# Patient Record
Sex: Female | Born: 1965 | Race: White | Hispanic: No | Marital: Married | Smoking: Never smoker
Health system: Southern US, Community
[De-identification: ages and names within clinical notes are randomized; demographics above are authoritative.]

## PROBLEM LIST (undated history)

## (undated) DIAGNOSIS — J45909 Unspecified asthma, uncomplicated: Secondary | ICD-10-CM

## (undated) DIAGNOSIS — N83209 Unspecified ovarian cyst, unspecified side: Secondary | ICD-10-CM

## (undated) DIAGNOSIS — J309 Allergic rhinitis, unspecified: Secondary | ICD-10-CM

## (undated) DIAGNOSIS — K644 Residual hemorrhoidal skin tags: Secondary | ICD-10-CM

## (undated) DIAGNOSIS — N888 Other specified noninflammatory disorders of cervix uteri: Secondary | ICD-10-CM

## (undated) DIAGNOSIS — IMO0002 Reserved for concepts with insufficient information to code with codable children: Secondary | ICD-10-CM

## (undated) DIAGNOSIS — F32A Depression, unspecified: Secondary | ICD-10-CM

## (undated) DIAGNOSIS — M1990 Unspecified osteoarthritis, unspecified site: Secondary | ICD-10-CM

## (undated) DIAGNOSIS — I10 Essential (primary) hypertension: Secondary | ICD-10-CM

## (undated) DIAGNOSIS — M4040 Postural lordosis, site unspecified: Secondary | ICD-10-CM

## (undated) DIAGNOSIS — Z8742 Personal history of other diseases of the female genital tract: Secondary | ICD-10-CM

## (undated) DIAGNOSIS — K649 Unspecified hemorrhoids: Secondary | ICD-10-CM

## (undated) DIAGNOSIS — F909 Attention-deficit hyperactivity disorder, unspecified type: Secondary | ICD-10-CM

## (undated) DIAGNOSIS — F329 Major depressive disorder, single episode, unspecified: Secondary | ICD-10-CM

## (undated) HISTORY — DX: Personal history of other diseases of the female genital tract: Z87.42

## (undated) HISTORY — PX: REDUCTION MAMMAPLASTY: SUR839

## (undated) HISTORY — DX: Residual hemorrhoidal skin tags: K64.4

## (undated) HISTORY — PX: UPPER GI ENDOSCOPY: SHX6162

## (undated) HISTORY — DX: Allergic rhinitis, unspecified: J30.9

## (undated) HISTORY — PX: BACK SURGERY: SHX140

## (undated) HISTORY — PX: BREAST SURGERY: SHX581

## (undated) HISTORY — DX: Other specified noninflammatory disorders of cervix uteri: N88.8

## (undated) HISTORY — DX: Unspecified ovarian cyst, unspecified side: N83.209

## (undated) HISTORY — PX: TONSILLECTOMY: SUR1361

---
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
## 2004-01-19 HISTORY — PX: ENDOMETRIAL ABLATION: SHX621

## 2015-01-19 HISTORY — PX: COLONOSCOPY: SHX174

## 2015-01-22 LAB — HM COLONOSCOPY

## 2016-07-09 ENCOUNTER — Encounter: Payer: Self-pay | Admitting: Emergency Medicine

## 2016-07-09 ENCOUNTER — Emergency Department (INDEPENDENT_AMBULATORY_CARE_PROVIDER_SITE_OTHER)
Admission: EM | Admit: 2016-07-09 | Discharge: 2016-07-09 | Disposition: A | Discharge status: Home / Self Care | Payer: BLUE CROSS/BLUE SHIELD | Source: Home / Self Care | Attending: Family Medicine | Admitting: Family Medicine

## 2016-07-09 DIAGNOSIS — R0789 Other chest pain: Secondary | ICD-10-CM

## 2016-07-09 HISTORY — DX: Postural lordosis, site unspecified: M40.40

## 2016-07-09 HISTORY — DX: Unspecified asthma, uncomplicated: J45.909

## 2016-07-09 HISTORY — DX: Reserved for concepts with insufficient information to code with codable children: IMO0002

## 2016-07-09 MED ORDER — DICLOFENAC SODIUM 1 % TD GEL: 1.0000 "application " | Freq: Three times a day (TID) | TRANSDERMAL | 1 refills | Status: DC

## 2016-07-09 NOTE — ED Provider Notes (Signed)
[facility]    CSN: [id number] Arrival date & time: 07/09/16  1314     History   Chief Complaint Chief Complaint  Patient presents with  . Abdominal Pain    HPI Adriana Gordon is a 51 y.o. female.   Patient complains of a 3 year history of constant pressure-like mild epigastric pain, described as "squeezing", occasionally associated with mild nausea when the pain is increased.  The pain is not affected by eating or inspiration.  Her bowel movements have been normal.  She has had complete GI evaluation that did not reveal a cause of her pain.  She feels well otherwise. Patient moved to the area from [place] three months ago. Past history of breast reduction 20 years ago, endometrial ablation, and C-section.   The history is provided by the patient.    Past Medical History:  Diagnosis Date  . Asthma   . 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   Past Surgical History:  Procedure Laterality Date  . ABDOMINAL SURGERY    . BACK SURGERY    . BREAST SURGERY    . TONSILLECTOMY      OB History    No data available       Home Medications    Prior to Admission medications   Medication Sig Start Date End Date Taking? Authorizing Provider  buPROPion (WELLBUTRIN XL) 300 MG 24 hr tablet Take 300 mg by mouth daily.   Yes [provider]  diclofenac sodium (VOLTAREN) 1 % GEL Apply 1 application topically 3 (three) times daily. Apply 2 to 3 gm each application 07/09/16   Lattie Haw, MD    Family History Family History  Problem Relation Age of Onset  . Hypertension Mother   . Depression Mother   . Sjogren's syndrome Mother   . Cancer Father   . Asthma Father   . COPD Father     Social History Social History  Substance Use Topics  . Smoking status: Never Smoker  . Smokeless tobacco: Never Used  . Alcohol use Yes     Allergies   Phenergan [promethazine  hcl]   Review of Systems Review of Systems  Constitutional: Negative for activity change, appetite change, chills, diaphoresis, fatigue, fever and unexpected weight change.  HENT: Negative.   Eyes: Negative.   Respiratory: Positive for chest tightness. Negative for shortness of breath and wheezing.   Cardiovascular: Negative.   Gastrointestinal: Positive for abdominal pain and nausea. Negative for abdominal distention, blood in stool, constipation, diarrhea and vomiting.  Genitourinary: Negative.   Musculoskeletal: Negative.   Neurological: Negative.      Physical Exam Triage Vital Signs ED Triage Vitals [07/09/16 1348]  Enc Vitals Group     BP (!) 156/95     Pulse Rate 100     Resp      Temp 98.7 F (37.1 C)     Temp Source Oral     SpO2 100 %     Weight 120 lb (54.4 kg)     Height 5\' 5"  (1.651 m)     Head Circumference      Peak Flow      Pain Score 5     Pain Loc      Pain Edu?      Excl. in GC?    No data found.   Updated Vital Signs BP (!) 156/95 (BP Location: Left  Arm)   Pulse 100   Temp 98.7 F (37.1 C) (Oral)   Ht 5\' 5"  (1.651 m)   Wt 120 lb (54.4 kg)   SpO2 100%   BMI 19.97 kg/m   Visual Acuity Right Eye Distance:   Left Eye Distance:   Bilateral Distance:    Right Eye Near:   Left Eye Near:    Bilateral Near:     Physical Exam  Constitutional: She appears well-developed and well-nourished. No distress.  HENT:  Head: Normocephalic.  Right Ear: External ear normal.  Left Ear: External ear normal.  Nose: Nose normal.  Mouth/Throat: Oropharynx is clear and moist.  Eyes: Conjunctivae and EOM are normal. Pupils are equal, round, and reactive to light.  Neck: Normal range of motion. Neck supple.  Cardiovascular: Normal rate, regular rhythm and normal heart sounds.   Pulmonary/Chest: Breath sounds normal. She has no wheezes. She has no rales. She exhibits tenderness.    Patient has distinct tenderness to palpation over her xiphoid and  sub-xiphoid area at well healed surgical scars from her breast reduction.  Palpation there recreates her pain.  Abdominal: Soft. Bowel sounds are normal. She exhibits no distension and no mass. There is no tenderness. There is no rebound and no guarding. No hernia.  Lymphadenopathy:    She has no cervical adenopathy.  Neurological: She is alert.  Skin: Skin is warm and dry. No rash noted.  Nursing note and vitals reviewed.    UC Treatments / Results  Labs (all labs ordered are listed, but only abnormal results are displayed) Labs Reviewed - No data to display  EKG  EKG Interpretation None       Radiology No results found.  Procedures Procedures (including critical care time)  Medications Ordered in UC Medications - No data to display   Initial Impression / Assessment and Plan / UC Course  I have reviewed the triage vital signs and the nursing notes.  Pertinent labs & imaging results that were available during my care of the patient were reviewed by me and considered in my medical decision making (see chart for details).    Begin conservative trial of topical diclofenac gel TID. Apply ice pack for 10 to 15 minutes, 2 to 3 times daily  Continue until pain and swelling decrease.  Followup with Dr. Rodney Langtonhomas Thekkekandam or Dr. Clementeen GrahamEvan Corey ([facility]) if not improving about two weeks.     Final Clinical Impressions(s) / UC Diagnoses   Final diagnoses:  Xiphoidalgia    New Prescriptions New Prescriptions   DICLOFENAC SODIUM (VOLTAREN) 1 % GEL    Apply 1 application topically 3 (three) times daily. Apply 2 to 3 gm each application     Lattie HawBeese, Prynce Jacober A, MD 07/11/16 1012

## 2016-07-09 NOTE — ED Triage Notes (Signed)
Epigastric pain, constant, 3 years

## 2016-07-09 NOTE — Discharge Instructions (Signed)
Apply ice pack for 10 to 15 minutes, 2 to 3 times daily  Continue until pain and swelling decrease.

## 2016-07-13 ENCOUNTER — Ambulatory Visit (INDEPENDENT_AMBULATORY_CARE_PROVIDER_SITE_OTHER): Payer: BLUE CROSS/BLUE SHIELD | Admitting: Sports Medicine

## 2016-07-13 DIAGNOSIS — R1900 Intra-abdominal and pelvic swelling, mass and lump, unspecified site: Secondary | ICD-10-CM

## 2016-07-13 DIAGNOSIS — R1013 Epigastric pain: Secondary | ICD-10-CM

## 2016-07-13 LAB — CBC WITH DIFFERENTIAL/PLATELET
Basophils Absolute: 71 cells/uL (ref 0–200)
Basophils Relative: 1 %
Eosinophils Absolute: 71 {cells}/uL (ref 15–500)
Eosinophils Relative: 1 %
HCT: 43.2 % (ref 35.0–45.0)
Hemoglobin: 13.8 g/dL (ref 11.7–15.5)
Lymphocytes Relative: 30 %
Lymphs Abs: 2130 cells/uL (ref 850–3900)
MCH: 31.1 pg (ref 27.0–33.0)
MCHC: 31.9 g/dL — ABNORMAL LOW (ref 32.0–36.0)
MCV: 97.3 fL (ref 80.0–100.0)
MPV: 10.4 fL (ref 7.5–12.5)
Monocytes Absolute: 710 cells/uL (ref 200–950)
Monocytes Relative: 10 %
Neutro Abs: 4118 {cells}/uL (ref 1500–7800)
Neutrophils Relative %: 58 %
Platelets: 324 K/uL (ref 140–400)
RBC: 4.44 MIL/uL (ref 3.80–5.10)
RDW: 13.4 % (ref 11.0–15.0)
WBC: 7.1 10*3/uL (ref 3.8–10.8)

## 2016-07-13 MED ORDER — SUCRALFATE 1 G PO TABS: 1.0000 g | ORAL_TABLET | Freq: Four times a day (QID) | ORAL | 0 refills | Status: DC

## 2016-07-13 MED ORDER — DEXLANSOPRAZOLE 60 MG PO CPDR: 60.0000 mg | DELAYED_RELEASE_CAPSULE | Freq: Every day | ORAL | 11 refills | Status: DC

## 2016-07-13 NOTE — Assessment & Plan Note (Addendum)
Adriana Gordon had a very extensive workup in the distant past including an upper endoscopy that showed what sounds to be chronic gastritis, she has been on acid blockers for short periods of time without much relief, she's also had an ultrasound in the past that she tells me was negative. Blood work in the past including H. pylori testing has been negative. This epigastric pain has been present for years. At one point her gastroenterologist, in spite of chronic gastritis on her upper endoscopy told her that it was musculoskeletal. She was seen in urgent care and was told that this may represent xiphoidalgia or costochondritis, however she is not the right age distribution for this. On exam today her pain is directly over her epigastrium and her stomach with not so much pain over the xiphoid process, costal margin, or scars from her breast reduction. She denies any symptoms of chronic GI bleed, but I'm convinced her pain is from her chronic gastritis.  I'm going get a CT of the abdomen and pelvis with oral and IV contrast in a nonemergent fashion, start Dexilant, Carafate. We are checking blood work, urinalysis, and she'll return in one month. She promises to take the medication consistently. If there is no improvement in one month we will consider referral to GI for another upper endoscopy, and certainly we can consider a HIDA scan to evaluate for concordant pain with CCK injection and gallbladder ejection fraction. Labs look good with the exception of a slightly low mean corpuscular hemoglobin concentration, considering concern for chronic gastritis I am going to add an anemia panel.  Return to see me in one month.  Dexilant is too expensive, switching to Nexium 40mg  twice a day

## 2016-07-13 NOTE — Progress Notes (Signed)
Subjective:    I'm seeing this patient as a consultation for:  Dr. Donna ChristenStephen Beese  CC: Epigastric pain  HPI: Adriana Gordon had a very extensive workup in the distant past including an upper endoscopy that showed what sounds to be chronic gastritis, she has been on acid blockers for short periods of time without much relief, she's also had an ultrasound in the past that she tells me was negative. Blood work in the past including H. pylori testing has been negative. This epigastric pain has been present for years. At one point her gastroenterologist, in spite of chronic gastritis on her upper endoscopy told her that it was musculoskeletal. She was seen in urgent care and was told that this may represent xiphoidalgia or costochondritis, however she is not the right age distribution for this. On exam today her pain is directly over her epigastrium and her stomach with not so much pain over the xiphoid process, costal margin, or scars from her breast reduction. She denies any symptoms of chronic GI bleed, but I'm convinced her pain is from her chronic gastritis.  Past medical history:  Negative.  See flowsheet/record as well for more information.  Surgical history: Negative.  See flowsheet/record as well for more information.  Family history: Negative.  See flowsheet/record as well for more information.  Social history: Negative.  See flowsheet/record as well for more information.  Allergies, and medications have been entered into the medical record, reviewed, and no changes needed.   Review of Systems: No headache, visual changes, nausea, vomiting, diarrhea, constipation, dizziness, abdominal pain, skin rash, fevers, chills, night sweats, weight loss, swollen lymph nodes, body aches, joint swelling, muscle aches, chest pain, shortness of breath, mood changes, visual or auditory hallucinations.   Objective:   General: Well Developed, well nourished, and in no acute distress.  Neuro/Psych: Alert and oriented  x3, extra-ocular muscles intact, able to move all 4 extremities, sensation grossly intact. Skin: Warm and dry, no rashes noted.  Respiratory: Not using accessory muscles, speaking in full sentences, trachea midline.  Cardiovascular: Pulses palpable, no extremity edema. Abdomen: I am unable to appreciate significant tenderness over the xiphoid process where it's only mild, or over the costal margin. Her breast reduction scar is well-healed and has no tenderness, she does have moderate to severe tenderness about 4 cm distal to the xiphoid process in the epigastrium and over the stomach.  Impression and Recommendations:   This case required medical decision making of moderate complexity.  Acute epigastric pain Adriana Gordon had a very extensive workup in the distant past including an upper endoscopy that showed what sounds to be chronic gastritis, she has been on acid blockers for short periods of time without much relief, she's also had an ultrasound in the past that she tells me was negative. Blood work in the past including H. pylori testing has been negative. This epigastric pain has been present for years. At one point her gastroenterologist, in spite of chronic gastritis on her upper endoscopy told her that it was musculoskeletal. She was seen in urgent care and was told that this may represent xiphoidalgia or costochondritis, however she is not the right age distribution for this. On exam today her pain is directly over her epigastrium and her stomach with not so much pain over the xiphoid process, costal margin, or scars from her breast reduction. She denies any symptoms of chronic GI bleed, but I'm convinced her pain is from her chronic gastritis.  I'm going get a CT of  the abdomen and pelvis with oral and IV contrast in a nonemergent fashion, start Dexilant, Carafate. We are checking blood work, urinalysis, and she'll return in one month. She promises to take the medication consistently. If there is no  improvement in one month we will consider referral to GI for another upper endoscopy, and certainly we can consider a HIDA scan to evaluate for concordant pain with CCK injection and gallbladder ejection fraction.  Return to see me in one month.

## 2016-07-14 ENCOUNTER — Ambulatory Visit (INDEPENDENT_AMBULATORY_CARE_PROVIDER_SITE_OTHER): Payer: BLUE CROSS/BLUE SHIELD

## 2016-07-14 ENCOUNTER — Telehealth: Payer: Self-pay | Admitting: *Deleted

## 2016-07-14 DIAGNOSIS — N888 Other specified noninflammatory disorders of cervix uteri: Secondary | ICD-10-CM | POA: Insufficient documentation

## 2016-07-14 DIAGNOSIS — R935 Abnormal findings on diagnostic imaging of other abdominal regions, including retroperitoneum: Secondary | ICD-10-CM

## 2016-07-14 DIAGNOSIS — R1013 Epigastric pain: Secondary | ICD-10-CM

## 2016-07-14 HISTORY — DX: Other specified noninflammatory disorders of cervix uteri: N88.8

## 2016-07-14 LAB — TSH: TSH: 2.1 m[IU]/L

## 2016-07-14 LAB — URINALYSIS
Bilirubin Urine: NEGATIVE
Glucose, UA: NEGATIVE
Hgb urine dipstick: NEGATIVE
Ketones, ur: NEGATIVE
Leukocytes, UA: NEGATIVE
Nitrite: NEGATIVE
Protein, ur: NEGATIVE
Specific Gravity, Urine: 1.016 (ref 1.001–1.035)
pH: 6 (ref 5.0–8.0)

## 2016-07-14 LAB — COMPREHENSIVE METABOLIC PANEL
AST: 11 U/L (ref 10–35)
Albumin: 4.7 g/dL (ref 3.6–5.1)
CO2: 24 mmol/L (ref 20–31)
Calcium: 9.7 mg/dL (ref 8.6–10.4)
Chloride: 101 mmol/L (ref 98–110)
Glucose, Bld: 90 mg/dL (ref 65–99)
Potassium: 4.1 mmol/L (ref 3.5–5.3)
Total Bilirubin: 0.6 mg/dL (ref 0.2–1.2)

## 2016-07-14 LAB — HEMOGLOBIN A1C
Hgb A1c MFr Bld: 5 % (ref <5.7)
Mean Plasma Glucose: 97 mg/dL

## 2016-07-14 LAB — COMPREHENSIVE METABOLIC PANEL WITH GFR
ALT: 11 U/L (ref 6–29)
Alkaline Phosphatase: 52 U/L (ref 33–130)
BUN: 15 mg/dL (ref 7–25)
Creat: 1 mg/dL (ref 0.50–1.05)
Sodium: 137 mmol/L (ref 135–146)
Total Protein: 7.5 g/dL (ref 6.1–8.1)

## 2016-07-14 LAB — LIPID PANEL W/REFLEX DIRECT LDL
Cholesterol: 204 mg/dL — ABNORMAL HIGH (ref <200)
HDL: 67 mg/dL (ref >50)
LDL-Cholesterol: 115 mg/dL — ABNORMAL HIGH
Non-HDL Cholesterol (Calc): 137 mg/dL — ABNORMAL HIGH (ref <130)
Total CHOL/HDL Ratio: 3 ratio (ref <5.0)
Triglycerides: 108 mg/dL (ref <150)

## 2016-07-14 LAB — URINE CULTURE: Organism ID, Bacteria: NO GROWTH

## 2016-07-14 LAB — LIPASE: Lipase: 14 U/L (ref 7–60)

## 2016-07-14 LAB — HIV ANTIBODY (ROUTINE TESTING W REFLEX): HIV 1&2 Ab, 4th Generation: NONREACTIVE

## 2016-07-14 LAB — AMYLASE: Amylase: 39 U/L (ref 21–101)

## 2016-07-14 LAB — VITAMIN D 25 HYDROXY (VIT D DEFICIENCY, FRACTURES): Vit D, 25-Hydroxy: 44 ng/mL (ref 30–100)

## 2016-07-14 MED ORDER — IOPAMIDOL (ISOVUE-300) INJECTION 61%
100.0000 mL | Freq: Once | INTRAVENOUS | Status: AC | PRN
  Administered 2016-07-14: 100 mL via INTRAVENOUS

## 2016-07-14 NOTE — Assessment & Plan Note (Signed)
There is an abnormality in the cervix with multiple low-density lesions, per radiology report this could represent multiple infected nabothian cysts however there is a chance that this could represent a low-grade uterine malignancy. Referral to OB/GYN for further evaluation.   As for the pain in the upper abdomen, I do think it still represents a gastritis, so plan does not change regarding the new acid blocking medications.

## 2016-07-14 NOTE — Telephone Encounter (Signed)
Called pt's pharmacy and informed Adriana Gordon that pt's prescription for Dexilant has been approved.Adriana Gordon, Adriana Gordon

## 2016-07-14 NOTE — Addendum Note (Signed)
Addended by: Monica BectonHEKKEKANDAM, Candace Begue J on: 07/14/2016 07:58 AM   Modules accepted: Orders

## 2016-07-14 NOTE — Addendum Note (Signed)
Addended by: Monica BectonHEKKEKANDAM, Tiesha Marich J on: 07/14/2016 11:50 AM   Modules accepted: Orders

## 2016-07-14 NOTE — Telephone Encounter (Signed)
Pt informed of approval..Reena Borromeo, Viann Shoveonya Lynetta

## 2016-07-15 ENCOUNTER — Encounter: Payer: Self-pay | Admitting: Physician Assistant

## 2016-07-15 ENCOUNTER — Ambulatory Visit (INDEPENDENT_AMBULATORY_CARE_PROVIDER_SITE_OTHER): Payer: BLUE CROSS/BLUE SHIELD | Admitting: Physician Assistant

## 2016-07-15 VITALS — BP 128/84 | HR 84 | Resp 16 | Ht 60.0 in | Wt 119.2 lb

## 2016-07-15 DIAGNOSIS — Z1231 Encounter for screening mammogram for malignant neoplasm of breast: Secondary | ICD-10-CM

## 2016-07-15 DIAGNOSIS — L989 Disorder of the skin and subcutaneous tissue, unspecified: Secondary | ICD-10-CM | POA: Insufficient documentation

## 2016-07-15 DIAGNOSIS — Z1283 Encounter for screening for malignant neoplasm of skin: Secondary | ICD-10-CM

## 2016-07-15 DIAGNOSIS — Z7689 Persons encountering health services in other specified circumstances: Secondary | ICD-10-CM | POA: Diagnosis not present

## 2016-07-15 DIAGNOSIS — E7800 Pure hypercholesterolemia, unspecified: Secondary | ICD-10-CM | POA: Insufficient documentation

## 2016-07-15 DIAGNOSIS — Z1239 Encounter for other screening for malignant neoplasm of breast: Secondary | ICD-10-CM

## 2016-07-15 LAB — VITAMIN B12: Vitamin B-12: 390 pg/mL (ref 200–1100)

## 2016-07-15 LAB — IRON AND TIBC
%SAT: 43 % (ref 11–50)
Iron: 167 ug/dL — ABNORMAL HIGH (ref 45–160)
TIBC: 384 ug/dL (ref 250–450)
UIBC: 217 ug/dL

## 2016-07-15 LAB — FERRITIN: Ferritin: 30 ng/mL (ref 10–232)

## 2016-07-15 LAB — FOLATE: Folate: 15.9 ng/mL (ref >5.4)

## 2016-07-15 MED ORDER — CALCIUM CARBONATE-VITAMIN D 600-400 MG-UNIT PO TABS: 1.0000 | ORAL_TABLET | Freq: Two times a day (BID) | ORAL | 11 refills | Status: DC

## 2016-07-15 MED ORDER — ESOMEPRAZOLE MAGNESIUM 40 MG PO CPDR: 40.0000 mg | DELAYED_RELEASE_CAPSULE | Freq: Two times a day (BID) | ORAL | 11 refills | Status: DC

## 2016-07-15 NOTE — Addendum Note (Signed)
Addended by: Monica BectonHEKKEKANDAM, Hazell Siwik J on: 07/15/2016 10:55 AM   Modules accepted: Orders

## 2016-07-15 NOTE — Patient Instructions (Addendum)
-   Please schedule an appointment for shave biopsy   Skin Biopsy A skin biopsy is a procedure to remove a sample of your skin (lesion) so it can be checked under a microscope. You may need a skin biopsy if you have a skin disease or abnormal changes in your skin. Tell a health care provider about:  Any allergies you have.  All medicines you are taking, including vitamins, herbs, eye drops, creams, and over-the-counter medicines.  Any problems you or family members have had with anesthetic medicines.  Any blood disorders you have.  Any surgeries you have had.  Any medical conditions you have. What are the risks? Generally, this is a safe procedure. However, problems may occur, including:  Infection.  Bleeding.  Allergic reaction to medicines.  Scarring.  What happens before the procedure?  Ask your health care provider about changing or stopping your regular medicines. This is especially important if you are taking diabetes medicines or blood thinners.  Follow instructions from your health care provider about how to care for your skin.  Ask your health care provider how your biopsy site will be marked or identified.  You may be given antibiotic medicine to prevent infection. What happens during the procedure?  To reduce your risk of infection: ? Your health care team will wash or sanitize their hands. ? Your skin will be washed with soap.  You may be given a medicine to help you relax (sedative).  You will be givena medicine to numb the area (local anesthetic).  The method that your health care provider will use for your skin biopsy will depend on the type of skin problem you have. Options include: ? Shave biopsy. Your health care provider will shave away layers of your skin lesion with a sharp blade. After shaving, a gel or ointment may be used to control bleeding. ? Punch biopsy. Your health care provider will use a tool to remove all or part of the lesion. This  leaves a small hole about the width of a pencil eraser. The area may be covered with a gel or ointment. ? Excisional or incisional biopsy. Your health care provider will use a surgical blade to remove all or part of your lesion.  Your skin biopsy site may be closed with stitches (sutures).  A bandage (dressing) will be applied. The procedure may vary among health care providers and hospitals. What happens after the procedure?  Your skin sample will be sent to a laboratory for examination.  Your skin biopsy site will be watched to make sure that it stops bleeding.  Do not drive for 24 hours if you received a sedative. This information is not intended to replace advice given to you by your health care provider. Make sure you discuss any questions you have with your health care provider. Document Released: 02/06/2004 Document Revised: 08/31/2015 Document Reviewed: 04/03/2014 [employer] Interactive Patient Education  [employer]2018 [employer]

## 2016-07-15 NOTE — Progress Notes (Signed)
HPI:                                                                Adriana Gordon is a 51 y.o. female who presents to [place]: [place] today to establish care  Current Concerns include skin check   Health Maintenance Health Maintenance  Topic Date Due  . TETANUS/TDAP  12/12/1983  . PAP SMEAR  12/11/1985  . MAMMOGRAM  12/12/2014  . COLONOSCOPY  12/12/2014  . INFLUENZA VACCINE  08/18/2016  . HIV Screening  Completed    GYN/Sexual Health  Menstrual status: postmenopausal  LMP: 2006  Sexually active: yes  Current contraception: none, s/p ablation   Past Medical History:  Diagnosis Date  . Allergic rhinitis   . Asthma    childhood  . External hemorrhoids   . History of menorrhagia   . 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 Ovarian cyst    Past Surgical History:  Procedure Laterality Date  . BACK SURGERY    . BREAST SURGERY     reduction  . CESAREAN SECTION    . COLONOSCOPY  2017  . ENDOMETRIAL ABLATION  2006  . TONSILLECTOMY     Social History  Substance Use Topics  . Smoking status: Never Smoker  . Smokeless tobacco: Never Used  . Alcohol use Yes   family history includes Alcohol abuse in her mother; Asthma in her father; COPD in her father; Cancer in her father; Depression in her brother and mother; Diabetes in her maternal grandmother; Heart attack in her paternal grandfather; Hyperlipidemia in her father; Hypertension in her father and mother; Prostate cancer in her father; Rheum arthritis in her cousin; Sjogren's syndrome in her mother; Stroke in her maternal grandfather.  ROS: negative except as noted in the HPI  Medications: Current Outpatient Prescriptions  Medication Sig Dispense Refill  . buPROPion (WELLBUTRIN XL) 300 MG 24 hr tablet Take 300 mg by mouth daily.    Marland Kitchen triamcinolone (NASACORT) 55 MCG/ACT AERO nasal inhaler Place 2 sprays into the nose daily.    . Calcium Carbonate-Vitamin D  600-400 MG-UNIT tablet Take 1 tablet by mouth 2 (two) times daily. 60 tablet 11  . esomeprazole (NEXIUM) 40 MG capsule Take 1 capsule (40 mg total) by mouth 2 (two) times daily before a meal. 60 capsule 11   No current facility-administered medications for this visit.    Allergies  Allergen Reactions  . Phenergan [Promethazine Hcl]        Objective:  BP 128/84   Pulse 84   Resp 16   Ht 5' (1.524 m)   Wt 119 lb 3.2 oz (54.1 kg)   LMP 12/11/2004   BMI 23.28 kg/m  Gen: well-groomed, cooperative, appears younger than stated age, not ill-appearing, no distress HEENT: normal conjunctiva, neck supple, no thyromegaly or tenderness Pulm: Normal work of breathing, normal phonation, clear to auscultation bilaterally CV: Normal rate, regular rhythm, s1 and s2 distinct, no murmurs, clicks or rubs, no carotid bruit Neuro: alert and oriented x 3, EOM's intact, normal tone, no tremor MSK: moving all extremities, normal gait and station, no peripheral edema Skin: warm and dry, approx 37m skin-colored papule on right anterior chest wall, multiple spider hemangiomas Psych: normal affect,  euthymic mood, normal speech and thought content   Results for orders placed or performed in visit on 07/13/16 (from the past 72 hour(s))  CBC with Differential/Platelet     Status: Abnormal   Collection Time: 07/13/16 11:32 AM  Result Value Ref Range   WBC 7.1 3.8 - 10.8 K/uL   RBC 4.44 3.80 - 5.10 MIL/uL   Hemoglobin 13.8 11.7 - 15.5 g/dL   HCT 43.2 35.0 - 45.0 %   MCV 97.3 80.0 - 100.0 fL   MCH 31.1 27.0 - 33.0 pg   MCHC 31.9 (L) 32.0 - 36.0 g/dL   RDW 13.4 11.0 - 15.0 %   Platelets 324 140 - 400 K/uL   MPV 10.4 7.5 - 12.5 fL   Neutro Abs 4,118 1,500 - 7,800 cells/uL   Lymphs Abs 2,130 850 - 3,900 cells/uL   Monocytes Absolute 710 200 - 950 cells/uL   Eosinophils Absolute 71 15 - 500 cells/uL   Basophils Absolute 71 0 - 200 cells/uL   Neutrophils Relative % 58 %   Lymphocytes Relative 30 %    Monocytes Relative 10 %   Eosinophils Relative 1 %   Basophils Relative 1 %   Smear Review Criteria for review not met   Comprehensive metabolic panel     Status: None   Collection Time: 07/13/16 11:32 AM  Result Value Ref Range   Sodium 137 135 - 146 mmol/L   Potassium 4.1 3.5 - 5.3 mmol/L   Chloride 101 98 - 110 mmol/L   CO2 24 20 - 31 mmol/L   Glucose, Bld 90 65 - 99 mg/dL   BUN 15 7 - 25 mg/dL   Creat 1.00 0.50 - 1.05 mg/dL    Comment:   For patients > or = 52 years of age: The upper reference limit for Creatinine is approximately 13% higher for people identified as African-American.      Total Bilirubin 0.6 0.2 - 1.2 mg/dL   Alkaline Phosphatase 52 33 - 130 U/L   AST 11 10 - 35 U/L   ALT 11 6 - 29 U/L   Total Protein 7.5 6.1 - 8.1 g/dL   Albumin 4.7 3.6 - 5.1 g/dL   Calcium 9.7 8.6 - 10.4 mg/dL  Lipase     Status: None   Collection Time: 07/13/16 11:32 AM  Result Value Ref Range   Lipase 14 7 - 60 U/L  Amylase     Status: None   Collection Time: 07/13/16 11:32 AM  Result Value Ref Range   Amylase 39 21 - 101 U/L    Comment: ** Please note change in reference range(s). **     Hemoglobin A1c     Status: None   Collection Time: 07/13/16 11:32 AM  Result Value Ref Range   Hgb A1c MFr Bld 5.0 <5.7 %    Comment:   For the purpose of screening for the presence of diabetes:   <5.7%       Consistent with the absence of diabetes 5.7-6.4 %   Consistent with increased risk for diabetes (prediabetes) >=6.5 %     Consistent with diabetes   This assay result is consistent with a decreased risk of diabetes.   Currently, no consensus exists regarding use of hemoglobin A1c for diagnosis of diabetes in children.   According to American Diabetes Association (ADA) guidelines, hemoglobin A1c <7.0% represents optimal control in non-pregnant diabetic patients. Different metrics may apply to specific patient populations. Standards of Medical Care in Diabetes (  ADA).      Mean  Plasma Glucose 97 mg/dL  Lipid Panel w/reflex Direct LDL     Status: Abnormal   Collection Time: 07/13/16 11:32 AM  Result Value Ref Range   Cholesterol 204 (H) <200 mg/dL   Triglycerides 108 <150 mg/dL   HDL 67 >50 mg/dL   Total CHOL/HDL Ratio 3.0 <5.0 Ratio   Non-HDL Cholesterol (Calc) 137 (H) <130 mg/dL    Comment:   For patients with diabetes plus 1 major ASCVD risk factor, treating to a non-HDL-C goal of <100 mg/dL (LDL-C of <70 mg/dL) is considered a therapeutic option.      LDL-Cholesterol 115 (H) mg/dL    Comment: Reference range: <100   Desirable range <100 mg/dL for primary prevention; <70 mg/dL for patients with CHD or diabetic patients with > or = 2 CHD risk factors.     The Martin-Hopkins calculation is a validated novel method that provides better accuracy than the Friedwald equation in the estimation of LDL-C, particularly when TG levels are 150-400 mg/dL and LDL-C levels are lower than 70 mg/dL. Reference:  Cresenciano Genre et al.  Comparison of a Novel Method vs the Aurelio Jew for Estimating Low-Density Lipoprotein Cholesterol Levels From the Standard Lipid Profile.  JAMA. 1950;932(67): 2061-2068.   For additional information, please refer to http://education.[facility].com/faq/FAQ164 (This link is being provided for informational/educational purposes only.)   TSH     Status: None   Collection Time: 07/13/16 11:32 AM  Result Value Ref Range   TSH 2.10 mIU/L    Comment:   Reference Range   > or = 20 Years  0.40-4.50   Pregnancy Range First trimester  0.26-2.66 Second trimester 0.55-2.73 Third trimester  0.43-2.91     VITAMIN D 25 Hydroxy (Vit-D Deficiency, Fractures)     Status: None   Collection Time: 07/13/16 11:32 AM  Result Value Ref Range   Vit D, 25-Hydroxy 44 30 - 100 ng/mL    Comment: Vitamin D Status           25-OH Vitamin D        Deficiency                <20 ng/mL        Insufficiency         20 - 29 ng/mL        Optimal              > or = 30 ng/mL   For 25-OH Vitamin D testing on patients on D2-supplementation and patients for whom quantitation of D2 and D3 fractions is required, the [facility] 25-OH VIT D, (D2,D3), LC/MS/MS is recommended: order code 364 [id number] 2521 (patients > 2 yrs).   Urinalysis     Status: None   Collection Time: 07/13/16 11:32 AM  Result Value Ref Range   Color, Urine YELLOW YELLOW   APPearance CLEAR CLEAR   Specific Gravity, Urine 1.016 1.001 - 1.035   pH 6.0 5.0 - 8.0   Glucose, UA NEGATIVE NEGATIVE   Bilirubin Urine NEGATIVE NEGATIVE   Ketones, ur NEGATIVE NEGATIVE   Hgb urine dipstick NEGATIVE NEGATIVE   Protein, ur NEGATIVE NEGATIVE   Nitrite NEGATIVE NEGATIVE   Leukocytes, UA NEGATIVE NEGATIVE  HIV antibody     Status: None   Collection Time: 07/13/16 11:32 AM  Result Value Ref Range   HIV 1&2 Ab, 4th Generation NONREACTIVE NONREACTIVE    Comment:   HIV-1 antigen and HIV-1/HIV-2 antibodies were not detected.  There is  no laboratory evidence of HIV infection.   HIV-1/2 Antibody Diff        Not indicated. HIV-1 RNA, Qual TMA          Not indicated.     PLEASE NOTE: This information has been disclosed to you from records whose confidentiality may be protected by state law. If your state requires such protection, then the state law prohibits you from making any further disclosure of the information without the specific written consent of the person to whom it pertains, or as otherwise permitted by law. A general authorization for the release of medical or other information is NOT sufficient for this purpose.   The performance of this assay has not been clinically validated in patients less than 65 years old.   For additional information please refer to http://education.[facility].com/faq/FAQ106.  (This link is being provided for informational/educational purposes only.)     Urine Culture     Status: None   Collection Time: 07/13/16 11:34 AM  Result Value Ref Range    Organism ID, Bacteria NO GROWTH   Vitamin B12     Status: None   Collection Time: 07/14/16  7:58 AM  Result Value Ref Range   Vitamin B-12 390 200 - 1,100 pg/mL  Folate     Status: None   Collection Time: 07/14/16  7:58 AM  Result Value Ref Range   Folate 15.9 >5.4 ng/mL    Comment: Reference Range >17 years:   Low: <3.4 ng/mL              Borderline: 3.4-5.4 ng/mL              Normal: >5.4 ng/mL     Iron and TIBC     Status: Abnormal   Collection Time: 07/14/16  7:58 AM  Result Value Ref Range   Iron 167 (H) 45 - 160 ug/dL   UIBC 217 ug/dL   TIBC 384 250 - 450 ug/dL   %SAT 43 11 - 50 %  Ferritin     Status: None   Collection Time: 07/14/16  7:58 AM  Result Value Ref Range   Ferritin 30 10 - 232 ng/mL   Ct Abdomen Pelvis W Contrast  Result Date: 07/14/2016 CLINICAL DATA:  Acute epigastric pain. EXAM: CT ABDOMEN AND PELVIS WITH CONTRAST TECHNIQUE: Multidetector CT imaging of the abdomen and pelvis was performed using the standard protocol following bolus administration of intravenous contrast. CONTRAST:  170m ISOVUE-300 IOPAMIDOL (ISOVUE-300) INJECTION 61% COMPARISON:  None. FINDINGS: Lower chest: Normal. Hepatobiliary: No focal liver abnormality is seen. No gallstones, gallbladder wall thickening, or biliary dilatation. Pancreas: Unremarkable. No pancreatic ductal dilatation or surrounding inflammatory changes. Spleen: Normal in size without focal abnormality. Adrenals/Urinary Tract: Adrenal glands are unremarkable. Kidneys are normal, without renal calculi, focal lesion, or hydronephrosis. Bladder is unremarkable. Stomach/Bowel: Stomach is within normal limits. Appendix appears normal. No evidence of bowel wall thickening, distention, or inflammatory changes. Vascular/Lymphatic: No significant vascular findings are present. No enlarged abdominal or pelvic lymph nodes. Reproductive: There is an abnormal soft tissue density at the cervix with multiple low-density areas. This could  represent multiple nabothian cysts with infection. However, the low-density extends into the endometrial cavity. The possibility of low-grade malignancy such as adenoma malignum should be considered. I recommend correlation with pelvic exam. Pelvic ultrasound and/or pelvic MRI may be useful for further characterization based on the findings of the pelvic exam. 2 cm cyst on the right ovary. Left ovary and uterus appear normal. Other: No  abdominal wall hernia or abnormality. No abdominopelvic ascites. Musculoskeletal: No acute or significant osseous findings. IMPRESSION: 1. Abnormal appearance of the cervix as described above. 2. Otherwise benign appearing abdomen and pelvis. Electronically Signed   By: Lorriane Shire M.D.   On: 07/14/2016 10:53    Depression screen PHQ 2/9 07/15/2016  Decreased Interest 0  Down, Depressed, Hopeless 0  PHQ - 2 Score 0    Assessment and Plan: 51 y.o. female with   1. Encounter to establish care - reviewed PMH - reviewed recent labs, which were wnl - negative PHQ2 - Colonoscopy - UTD per patient, Dr. Baxter Flattery, requesting outside records - referred to GYN for Pap due to CT finding of abnormal appearing cervix  2. Borderline Hypercholesteremia - therapeutic lifestyle  3. Screening for breast cancer - MM DIGITAL SCREENING BILATERAL; Future  4. Encounter for screening for malignant neoplasm of skin   5. Skin lesion of chest wall - return for shave biopsy   Patient education and anticipatory guidance given Patient agrees with treatment plan Follow-up in 1 week for skin biopsy or sooner as needed  Darlyne Russian PA-C

## 2016-07-22 ENCOUNTER — Encounter: Payer: Self-pay | Admitting: Physician Assistant

## 2016-07-22 ENCOUNTER — Ambulatory Visit (INDEPENDENT_AMBULATORY_CARE_PROVIDER_SITE_OTHER): Payer: BLUE CROSS/BLUE SHIELD | Admitting: Physician Assistant

## 2016-07-22 VITALS — BP 143/93 | HR 81 | Wt 123.0 lb

## 2016-07-22 DIAGNOSIS — Z7689 Persons encountering health services in other specified circumstances: Secondary | ICD-10-CM

## 2016-07-22 DIAGNOSIS — Z01818 Encounter for other preprocedural examination: Secondary | ICD-10-CM | POA: Diagnosis not present

## 2016-07-22 DIAGNOSIS — L989 Disorder of the skin and subcutaneous tissue, unspecified: Secondary | ICD-10-CM

## 2016-07-22 NOTE — Progress Notes (Signed)
Vitals:   07/22/16 0948  BP: (!) 143/93  Pulse: 81    Procedure:  Excision of skin lesion, right anterior upper chest wall Risks, benefits, and alternatives explained and consent obtained. Time out conducted. Surface prepped with alcohol. 1.5 cc sensorcaine with epinephine infiltrated in a field block. Adequate anesthesia ensured. Area prepped and draped in a sterile fashion. Excision performed with: shave biopsy Hemostasis achieved with silver nitrate.  Pt tolerated procedure and stable. Instructed on wound care and follow-up as needed for signs of infection/complication.

## 2016-07-26 NOTE — Progress Notes (Signed)
Hi Daje,  Your skin biopsy did show that the lesion was an atypical nevus. These lesions are not cancerous but are associated with a higher risk of developing melanoma, especially if there is a family history of melanoma.  The recommendation is to have annual head-to-toe skin exams with myself or a dermatologist. Continue wearing broad-spectrum sunscreen  Perform self-skin checks monthly. Photograph any suspicious lesions and monitor them for changes in size,color, border, texture  Best, Vinetta Bergamoharley

## 2016-07-28 ENCOUNTER — Ambulatory Visit (INDEPENDENT_AMBULATORY_CARE_PROVIDER_SITE_OTHER): Payer: BLUE CROSS/BLUE SHIELD

## 2016-07-28 ENCOUNTER — Encounter: Payer: Self-pay | Admitting: Physician Assistant

## 2016-07-28 ENCOUNTER — Encounter: Payer: Self-pay | Admitting: Obstetrics & Gynecology

## 2016-07-28 ENCOUNTER — Ambulatory Visit (INDEPENDENT_AMBULATORY_CARE_PROVIDER_SITE_OTHER): Payer: BLUE CROSS/BLUE SHIELD | Admitting: Obstetrics & Gynecology

## 2016-07-28 VITALS — BP 132/83 | HR 88 | Resp 16 | Ht 60.0 in | Wt 119.0 lb

## 2016-07-28 DIAGNOSIS — Z1151 Encounter for screening for human papillomavirus (HPV): Secondary | ICD-10-CM

## 2016-07-28 DIAGNOSIS — N888 Other specified noninflammatory disorders of cervix uteri: Secondary | ICD-10-CM

## 2016-07-28 DIAGNOSIS — Z1231 Encounter for screening mammogram for malignant neoplasm of breast: Secondary | ICD-10-CM | POA: Diagnosis not present

## 2016-07-28 DIAGNOSIS — Z01419 Encounter for gynecological examination (general) (routine) without abnormal findings: Secondary | ICD-10-CM

## 2016-07-28 DIAGNOSIS — N83201 Unspecified ovarian cyst, right side: Secondary | ICD-10-CM

## 2016-07-28 DIAGNOSIS — Z124 Encounter for screening for malignant neoplasm of cervix: Secondary | ICD-10-CM | POA: Diagnosis not present

## 2016-07-28 NOTE — Progress Notes (Signed)
GYNECOLOGY ANNUAL PREVENTATIVE CARE ENCOUNTER NOTE  Subjective:   Adriana Gordon is a 51 y.o. [id number] female here to establish care and for a routine annual gynecologic exam. She was also referred for incidental finding of cervical mass on recent CT scan (see imaging report below)' scan obtained during workup for epigastric pain.  Scan also showed 2 cm right ovarian cyst.  Patient denies any current pain. Had endometrial ablation several years ago, has been menopausal for 10 years and had no postmenopausal bleeding.  Denies abnormal vaginal bleeding, discharge, pelvic pain, problems with intercourse or other gynecologic concerns.    Gynecologic History Patient's last menstrual period was 12/11/2004. Contraception: post menopausal status and vasectomy Last Pap: 2016. Results were: normal Last mammogram: 2016. Results were: normal  Obstetric History OB History  Gravida Para Term Preterm AB Living  2 2 2     2   SAB TAB Ectopic Multiple Live Births               # Outcome Date GA Lbr Len/2nd Weight Sex Delivery Anes PTL Lv  2 Term    6 lb 11 oz (3.033 kg) F Vag-Spont     1 Term    7 lb 8 oz (3.402 kg) M CS-Unspec         Past Medical History:  Diagnosis Date  . Allergic rhinitis   . Asthma    childhood  . External hemorrhoids   . History of menorrhagia   . 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 Ovarian cyst     Past Surgical History:  Procedure Laterality Date  . BACK SURGERY    . BREAST SURGERY     reduction  . CESAREAN SECTION    . COLONOSCOPY  2017  . ENDOMETRIAL ABLATION  2006  . TONSILLECTOMY      Current Outpatient Prescriptions on File Prior to Visit  Medication Sig Dispense Refill  . buPROPion (WELLBUTRIN XL) 300 MG 24 hr tablet Take 300 mg by mouth daily.    . Calcium Carbonate-Vitamin D 600-400 MG-UNIT tablet Take 1 tablet by mouth 2 (two) times daily. 60 tablet 11  . esomeprazole (NEXIUM) 40 MG capsule Take 1 capsule (40 mg total) by mouth  2 (two) times daily before a meal. 60 capsule 11  . sucralfate (CARAFATE) 1 g tablet Take 1 g by mouth 4 (four) times daily.    Marland Kitchen triamcinolone (NASACORT) 55 MCG/ACT AERO nasal inhaler Place 2 sprays into the nose daily.     No current facility-administered medications on file prior to visit.     Allergies  Allergen Reactions  . Phenergan [Promethazine Hcl]     Social History   Social History  . Marital status: Single    Spouse name: N/A  . Number of children: N/A  . Years of education: N/A   Occupational History  . Not on file.   Social History Main Topics  . Smoking status: Never Smoker  . Smokeless tobacco: Never Used  . Alcohol use Yes  . Drug use: No  . Sexual activity: Yes    Birth control/ protection: Post-menopausal   Other Topics Concern  . Not on file   Social History Narrative  . No narrative on file    Family History  Problem Relation Age of Onset  . Hypertension Mother   . Depression Mother   . Sjogren's syndrome Mother   . Alcohol abuse Mother   . Cancer Father   . Asthma  Father   . COPD Father   . Hypertension Father   . Hyperlipidemia Father   . Prostate cancer Father   . Depression Brother   . Diabetes Maternal Grandmother   . Stroke Maternal Grandfather   . Heart attack Paternal Grandfather   . Rheum arthritis Cousin     The following portions of the patient's history were reviewed and updated as appropriate: allergies, current medications, past family history, past medical history, past social history, past surgical history and problem list.  Review of Systems Pertinent items noted in HPI and remainder of comprehensive ROS otherwise negative.   Objective:  BP 132/83   Pulse 88   Resp 16   Ht 5' (1.524 m)   Wt 119 lb (54 kg)   LMP 12/11/2004   BMI 23.24 kg/m  CONSTITUTIONAL: Well-developed, well-nourished female in no acute distress.  HENT:  Normocephalic, atraumatic, External right and left ear normal. Oropharynx is clear and  moist EYES: Conjunctivae and EOM are normal. Pupils are equal, round, and reactive to light. No scleral icterus.  NECK: Normal range of motion, supple, no masses.  Normal thyroid.  SKIN: Skin is warm and dry. No rash noted. Not diaphoretic. No erythema. No pallor. NEUROLOGIC: Alert and oriented to person, place, and time. Normal reflexes, muscle tone coordination. No cranial nerve deficit noted. PSYCHIATRIC: Normal mood and affect. Normal behavior. Normal judgment and thought content. CARDIOVASCULAR: Normal heart rate noted, regular rhythm RESPIRATORY: Clear to auscultation bilaterally. Effort and breath sounds normal, no problems with respiration noted. BREASTS: Symmetric in size. No masses, skin changes, nipple drainage, or lymphadenopathy. ABDOMEN: Soft, normal bowel sounds, no distention noted.  No tenderness, rebound or guarding.  PELVIC: Normal appearing external genitalia; normal appearing vaginal mucosa and cervix. No abnormal cervical masses noted.  No abnormal discharge noted.  Pap smear obtained.  Normal uterine size, no other palpable masses, no uterine or adnexal tenderness. MUSCULOSKELETAL: Normal range of motion. No tenderness.  No cyanosis, clubbing, or edema.  2+ distal pulses.  Imaging Ct Abdomen Pelvis W Contrast  Result Date: 07/14/2016 CLINICAL DATA:  Acute epigastric pain. EXAM: CT ABDOMEN AND PELVIS WITH CONTRAST TECHNIQUE: Multidetector CT imaging of the abdomen and pelvis was performed using the standard protocol following bolus administration of intravenous contrast. CONTRAST:  ISOVUE-300 IOPAMIDOL (ISOVUE-300) INJECTION 61% COMPARISON:  None. FINDINGS: Lower chest: Normal. Hepatobiliary: No focal liver abnormality is seen. No gallstones, gallbladder wall thickening, or biliary dilatation. Pancreas: Unremarkable. No pancreatic ductal dilatation or surrounding inflammatory changes. Spleen: Normal in size without focal abnormality. Adrenals/Urinary Tract: Adrenal glands  are unremarkable. Kidneys are normal, without renal calculi, focal lesion, or hydronephrosis. Bladder is unremarkable. Stomach/Bowel: Stomach is within normal limits. Appendix appears normal. No evidence of bowel wall thickening, distention, or inflammatory changes. Vascular/Lymphatic: No significant vascular findings are present. No enlarged abdominal or pelvic lymph nodes. Reproductive: There is an abnormal soft tissue density at the cervix with multiple low-density areas. This could represent multiple nabothian cysts with infection. However, the low-density extends into the endometrial cavity. The possibility of low-grade malignancy such as adenoma malignum should be considered. I recommend correlation with pelvic exam. Pelvic ultrasound and/or pelvic MRI may be useful for further characterization based on the findings of the pelvic exam. 2 cm cyst on the right ovary. Left ovary and uterus appear normal. Other: No abdominal wall hernia or abnormality. No abdominopelvic ascites. Musculoskeletal: No acute or significant osseous findings. IMPRESSION: 1. Abnormal appearance of the cervix as described above. 2. Otherwise  benign appearing abdomen and pelvis. Electronically Signed   By: Francene BoyersJames  Maxwell M.D.   On: 07/14/2016 10:53     Assessment:  Annual gynecologic examination with pap smear Cervical mass and right ovarian cyst seen on recent CT scan   Plan:  Will follow up results of pap smear and manage accordingly. Mammogram scheduled Pelvic ultrasound ordered for further characterization of cervical mass and ovarian cyst; possible post-endometrial ablation findings given extension into endometrium? Routine preventative health maintenance measures emphasized. Please refer to After Visit Summary for other counseling recommendations.    Jaynie CollinsUGONNA  Nashawn Hillock, MD, FACOG Attending Obstetrician & Gynecologist, [facility]

## 2016-07-28 NOTE — Patient Instructions (Signed)
Preventive Care 40-64 Years, Female Preventive care refers to lifestyle choices and visits with your health care provider that can promote health and wellness. What does preventive care include?  A yearly physical exam. This is also called an annual well check.  Dental exams once or twice a year.  Routine eye exams. Ask your health care provider how often you should have your eyes checked.  Personal lifestyle choices, including: ? Daily care of your teeth and gums. ? Regular physical activity. ? Eating a healthy diet. ? Avoiding tobacco and drug use. ? Limiting alcohol use. ? Practicing safe sex. ? Taking low-dose aspirin daily starting at age 52. ? Taking vitamin and mineral supplements as recommended by your health care provider. What happens during an annual well check? The services and screenings done by your health care provider during your annual well check will depend on your age, overall health, lifestyle risk factors, and family history of disease. Counseling Your health care provider may ask you questions about your:  Alcohol use.  Tobacco use.  Drug use.  Emotional well-being.  Home and relationship well-being.  Sexual activity.  Eating habits.  Work and work Statistician.  Method of birth control.  Menstrual cycle.  Pregnancy history.  Screening You may have the following tests or measurements:  Height, weight, and BMI.  Blood pressure.  Lipid and cholesterol levels. These may be checked every 5 years, or more frequently if you are over 52 years old.  Skin check.  Lung cancer screening. You may have this screening every year starting at age 52 if you have a 30-pack-year history of smoking and currently smoke or have quit within the past 15 years.  Fecal occult blood test (FOBT) of the stool. You may have this test every year starting at age 52.  Flexible sigmoidoscopy or colonoscopy. You may have a sigmoidoscopy every 5 years or a colonoscopy  every 10 years starting at age 52.  Hepatitis C blood test.  Hepatitis B blood test.  Sexually transmitted disease (STD) testing.  Diabetes screening. This is done by checking your blood sugar (glucose) after you have not eaten for a while (fasting). You may have this done every 1-3 years.  Mammogram. This may be done every 1-2 years. Talk to your health care provider about when you should start having regular mammograms. This may depend on whether you have a family history of breast cancer.  BRCA-related cancer screening. This may be done if you have a family history of breast, ovarian, tubal, or peritoneal cancers.  Pelvic exam and Pap test. This may be done every 3 years starting at age 52. Starting at age 36, this may be done every 5 years if you have a Pap test in combination with an HPV test.  Bone density scan. This is done to screen for osteoporosis. You may have this scan if you are at high risk for osteoporosis.  Discuss your test results, treatment options, and if necessary, the need for more tests with your health care provider. Vaccines Your health care provider may recommend certain vaccines, such as:  Influenza vaccine. This is recommended every year.  Tetanus, diphtheria, and acellular pertussis (Tdap, Td) vaccine. You may need a Td booster every 10 years.  Varicella vaccine. You may need this if you have not been vaccinated.  Zoster vaccine. You may need this after age 52.  Measles, mumps, and rubella (MMR) vaccine. You may need at least one dose of MMR if you were born in  [birth year] or later. You may also need a second dose.  Pneumococcal 13-valent conjugate (PCV13) vaccine. You may need this if you have certain conditions and were not previously vaccinated.  Pneumococcal polysaccharide (PPSV23) vaccine. You may need one or two doses if you smoke cigarettes or if you have certain conditions.  Meningococcal vaccine. You may need this if you have certain  conditions.  Hepatitis A vaccine. You may need this if you have certain conditions or if you travel or work in places where you may be exposed to hepatitis A.  Hepatitis B vaccine. You may need this if you have certain conditions or if you travel or work in places where you may be exposed to hepatitis B.  Haemophilus influenzae type b (Hib) vaccine. You may need this if you have certain conditions.  Talk to your health care provider about which screenings and vaccines you need and how often you need them. This information is not intended to replace advice given to you by your health care provider. Make sure you discuss any questions you have with your health care provider. Document Released: 01/31/2015 Document Revised: 09/24/2015 Document Reviewed: 11/05/2014 Elsevier Interactive Patient Education  2017 [employer].

## 2016-08-02 LAB — CYTOLOGY - PAP
Diagnosis: NEGATIVE
HPV (WINDOPATH): NOT DETECTED

## 2016-08-06 ENCOUNTER — Encounter: Payer: Self-pay | Admitting: Physician Assistant

## 2016-08-10 ENCOUNTER — Ambulatory Visit (INDEPENDENT_AMBULATORY_CARE_PROVIDER_SITE_OTHER): Payer: BLUE CROSS/BLUE SHIELD | Admitting: Sports Medicine

## 2016-08-10 ENCOUNTER — Encounter: Payer: Self-pay | Admitting: Internal Medicine

## 2016-08-10 ENCOUNTER — Encounter: Payer: Self-pay | Admitting: Sports Medicine

## 2016-08-10 DIAGNOSIS — L989 Disorder of the skin and subcutaneous tissue, unspecified: Secondary | ICD-10-CM | POA: Diagnosis not present

## 2016-08-10 DIAGNOSIS — R1013 Epigastric pain: Secondary | ICD-10-CM

## 2016-08-10 MED ORDER — SUCRALFATE 1 G PO TABS: 1.0000 g | ORAL_TABLET | Freq: Four times a day (QID) | ORAL | 1 refills | Status: DC

## 2016-08-10 NOTE — Assessment & Plan Note (Signed)
History of gastritis on upper endoscopy. In the past H. pylori testing has been negative. Nexium twice a day is really not helped her symptoms, the addition of Carafate also really didn't help. Referral to Dr. Marina GoodellPerry per patient request to consider repeat upper endoscopy. Considering her intermittent pain, some in the right upper quadrant we are also going to proceed with a HIDA scan.

## 2016-08-10 NOTE — Assessment & Plan Note (Signed)
Wider excision with punch biopsy, closed with #3 Ethilon sutures. Return in 10 days for suture removal.

## 2016-08-10 NOTE — Addendum Note (Signed)
Addended by: Collie SiadICHARDSON, Israella Hubert M on: 08/10/2016 03:04 PM   Modules accepted: Orders

## 2016-08-10 NOTE — Progress Notes (Signed)
  Subjective:    CC: Follow-up  HPI: This is a pleasant 51 year old female, I saw her a month ago with epigastric pain, she does have a history of gastritis, morbid history is detailed in the assessment and plan. We started Nexium twice a day, Carafate without much improvement. In was intermittent, and did have some radiation to the right upper quadrant and we have not yet evaluated her gallbladder.  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  Past medical history:  Negative.  See flowsheet/record as well for more information.  Surgical history: Negative.  See flowsheet/record as well for more information.  Family history: Negative.  See flowsheet/record as well for more information.  Social history: Negative.  See flowsheet/record as well for more information.  Allergies, and medications have been entered into the medical record, reviewed, and no changes needed.   Review of Systems: No fevers, chills, night sweats, weight loss, chest pain, or shortness of breath.   Objective:    General: Well Developed, well nourished, and in no acute distress.  Neuro: Alert and oriented x3, extra-ocular muscles intact, sensation grossly intact.  HEENT: Normocephalic, atraumatic, pupils equal round reactive to light, neck supple, no masses, no lymphadenopathy, thyroid nonpalpable.  Skin: Warm and dry, no rashes. Healed remnant of prior shave biopsy. Cardiac: Regular rate and rhythm, no murmurs rubs or gallops, no lower extremity edema.  Respiratory: Clear to auscultation bilaterally. Not using accessory muscles, speaking in full sentences.  Procedure:  6 mm punch biopsy of right upper chest dysplastic nevus Risks, benefits, and alternatives explained and consent obtained. Time out conducted. Surface prepped with alcohol. 3cc lidocaine with epinephine  infiltrated in a field block. Adequate anesthesia ensured. Area prepped and draped in a sterile fashion. Excision performed with: Using a 6mm punch biopsy, I removed a circumferential piece of skin, I then placed #3, 5-0 simple interrupted Ethilon sutures to close the incision after undermining the edges of it to reduce wound tension. Hemostasis achieved. Pt stable.  Impression and Recommendations:    Skin lesion of chest wall Wider excision with punch biopsy, closed with #3 Ethilon sutures. Return in 10 days for suture removal.  Acute epigastric pain History of gastritis on upper endoscopy. In the past H. pylori testing has been negative. Nexium twice a day is really not helped her symptoms, the addition of Carafate also really didn't help. Referral to Dr. Marina GoodellPerry per patient request to consider repeat upper endoscopy. Considering her intermittent pain, some in the right upper quadrant we are also going to proceed with a HIDA scan.

## 2016-08-20 ENCOUNTER — Encounter: Payer: Self-pay | Admitting: Sports Medicine

## 2016-08-20 ENCOUNTER — Ambulatory Visit (INDEPENDENT_AMBULATORY_CARE_PROVIDER_SITE_OTHER): Payer: BLUE CROSS/BLUE SHIELD | Admitting: Sports Medicine

## 2016-08-20 DIAGNOSIS — L989 Disorder of the skin and subcutaneous tissue, unspecified: Secondary | ICD-10-CM

## 2016-08-20 NOTE — Assessment & Plan Note (Signed)
Melanocytic nevus removed in entirety, sutures removed, no further disease.

## 2016-08-20 NOTE — Progress Notes (Signed)
  Subjective: 1 week post repeat excision, doing well. Margins were clear.   Objective: General: Well-developed, well-nourished, and in no acute distress. Right chest: Incision is clean, dry, intact, sutures removed.  Assessment/plan:   Skin lesion of chest wall Melanocytic nevus removed in entirety, sutures removed, no further disease.

## 2016-08-24 ENCOUNTER — Other Ambulatory Visit: Payer: Self-pay | Admitting: Sports Medicine

## 2016-08-24 DIAGNOSIS — R1011 Right upper quadrant pain: Secondary | ICD-10-CM

## 2016-09-02 ENCOUNTER — Encounter (HOSPITAL_COMMUNITY)
Admission: RE | Admit: 2016-09-02 | Discharge: 2016-09-02 | Disposition: A | Discharge status: Home / Self Care | Payer: BLUE CROSS/BLUE SHIELD | Source: Ambulatory Visit | Attending: Sports Medicine | Admitting: Sports Medicine

## 2016-09-02 DIAGNOSIS — R1011 Right upper quadrant pain: Secondary | ICD-10-CM

## 2016-09-02 MED ORDER — TECHNETIUM TC 99M MEBROFENIN IV KIT
5.0000 | PACK | Freq: Once | INTRAVENOUS | Status: AC | PRN
  Administered 2016-09-02: 5 via INTRAVENOUS

## 2016-09-30 ENCOUNTER — Ambulatory Visit (INDEPENDENT_AMBULATORY_CARE_PROVIDER_SITE_OTHER): Payer: BLUE CROSS/BLUE SHIELD | Admitting: Internal Medicine

## 2016-09-30 ENCOUNTER — Encounter: Payer: Self-pay | Admitting: Internal Medicine

## 2016-09-30 VITALS — BP 136/90 | HR 68 | Ht 60.0 in | Wt 121.2 lb

## 2016-09-30 DIAGNOSIS — R6881 Early satiety: Secondary | ICD-10-CM

## 2016-09-30 DIAGNOSIS — R1013 Epigastric pain: Secondary | ICD-10-CM | POA: Diagnosis not present

## 2016-09-30 DIAGNOSIS — K649 Unspecified hemorrhoids: Secondary | ICD-10-CM | POA: Diagnosis not present

## 2016-09-30 MED ORDER — DICYCLOMINE HCL 20 MG PO TABS: 20.0000 mg | ORAL_TABLET | Freq: Four times a day (QID) | ORAL | 0 refills | Status: DC | PRN

## 2016-09-30 NOTE — Patient Instructions (Addendum)
We have sent the following medications to your pharmacy for you to pick up at your convenience: Bentyl   You have been scheduled for a gastric emptying [facility] on 10/13/2016 at 7:30am. Please arrive at least 15 minutes prior to your appointment for registration. Please make certain not to have anything to eat or drink after midnight the night before your test. Hold all stomach medications (ex: Zofran, phenergan, Reglan) 48 hours prior to your test. If you need to reschedule your appointment, please contact radiology scheduling at [phone]. _____________________________________________________________________ A gastric-emptying study measures how long it takes for food to move through your stomach. There are several ways to measure stomach emptying. In the most common test, you eat food that contains a small amount of radioactive material. A scanner that detects the movement of the radioactive material is placed over your abdomen to monitor the rate at which food leaves your stomach. This test normally takes about 4 hours to complete. _____________________________________________________________________  Bonita QuinYou have been scheduled for an endoscopy. Please follow written instructions given to you at your visit today. If you use inhalers (even only as needed), please bring them with you on the day of your procedure. Your physician has requested that you go to www.startemmi.com and enter the access code given to you at your visit today. This web site gives a general overview about your procedure. However, you should still follow specific instructions given to you by our office regarding your preparation for the procedure.   We will contact you to schedule the hemorrhoid banding

## 2016-10-01 ENCOUNTER — Encounter: Payer: Self-pay | Admitting: Internal Medicine

## 2016-10-01 NOTE — Progress Notes (Signed)
HISTORY OF PRESENT ILLNESS:  Adriana Gordon is a 51 y.o. female , retired Runner, broadcasting/film/video, with no significant past medical history who is sent today by her primary care provider Dr. Benjamin Stain with chief complaints of chronic epigastric pain and symptomatic hemorrhoids. First, the patient tells me that she has had problems with epigastric discomfort for about 3 years. She describes this as a spasm or pressure sensation, such as having a fist driven into the epigastric and substernal region. This bothers her most every day and most all the time. There is varying intensity where it may become somewhat intolerable several times per day or she may go as long as one month without intense pain. However, she reports some constant underlying level of discomfort at all times. There is no disruption of sleep. She cannot identify any exacerbating or relieving factors. No change with meals, position, activity, or medications. She tells me that she was on Nexium 40 mg twice daily and Carafate 4 times daily for several months with no change in symptoms. Abdominal ultrasound was negative. Normal hepatobiliary scan August 2018. Negative CT scan of the abdomen and pelvis except for abnormal-appearing cervix (she has been evaluated by gynecology regarding this finding). She has occasional nausea but no vomiting. Her weight is been stable. She does describe occasional postprandial fullness and wonders about a gastric empty scan. She has tried Levsin sublingual previously without improvement. She inquires about Bentyl as an option. She does have a history of cervical spine surgery as well as breast reduction remotely. She does not smoke and has 3 alcoholic beverages per week  Next, she complains of symptomatic hemorrhoids. Some prolapse and irritation. She tells me that medicated suppositories were not helpful. She is interested in an specifically inquires about banding. She underwent complete colonoscopy in [facility] 01/22/2015 for  routine screening. The examination was said to be complete with adequate prep. No abnormalities. Follow-up was recommended in 7 years.  REVIEW OF SYSTEMS:  All non-GI ROS negative except for allergies  Past Medical History:  Diagnosis Date  . Allergic rhinitis   . Asthma    childhood  . External hemorrhoids   . History of menorrhagia   . 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 Ovarian cyst     Past Surgical History:  Procedure Laterality Date  . BACK SURGERY    . BREAST SURGERY     reduction  . CESAREAN SECTION    . COLONOSCOPY  2017  . ENDOMETRIAL ABLATION  2006  . REDUCTION MAMMAPLASTY    . TONSILLECTOMY      Social History Adriana Gordon  reports that she has never smoked. She has never used smokeless tobacco. She reports that she drinks alcohol. She reports that she does not use drugs.  family history includes Alcohol abuse in her mother; Asthma in her father; COPD in her father; Cancer in her father; Depression in her brother and mother; Diabetes in her maternal grandmother; Heart attack in her paternal grandfather; Hyperlipidemia in her father; Hypertension in her father and mother; Prostate cancer in her father; Rheum arthritis in her cousin; Sjogren's syndrome in her mother; Stroke in her maternal grandfather.  Allergies  Allergen Reactions  . Phenergan [Promethazine Hcl]        PHYSICAL EXAMINATION: Vital signs: BP 136/90   Pulse 68   Ht 5' (1.524 m)   Wt 121 lb 3.2 oz (55 kg)   LMP 12/11/2004   BMI 23.67 kg/m   Constitutional: generally well-appearing, no acute  distress Psychiatric: alert and oriented x3, cooperative Eyes: extraocular movements intact, anicteric, conjunctiva pink Mouth: oral pharynx moist, no lesions Neck: suppleWithout family Lymph: no lymphadenopathy Cardiovascular: heart regular rate and rhythm, no murmur Lungs: clear to auscultation bilaterally Abdomen: soft, nontender, nondistended, no obvious ascites, no peritoneal  signs, normal bowel sounds, no organomegaly Rectal:No external abnormalities. Inflamed internal hemorrhoids grade 2. Extremities: no clubbing cyanosis or lower extremity edema bilaterally Skin: no lesions on visible extremities Neuro: No focal deficits. Normal DTRs  ASSESSMENT:  #1. Chronic epigastric discomfort. Atypical. Likely musculoskeletal based on description. Rule out gastrointestinal mucosal process. #2. Complaints of early satiety. Rule out gastroparesis #3. Symptomatic hemorrhoids. Appropriate candidate for banding #4. Normal index colonoscopy January 2017. Recommend follow-up in 7 years (as opposed to 10 years...? Adequate prep versus good or excellent;? Family history of colon polyps in brother and mother)    PLAN:  #1. Schedule diagnostic EGD.The nature of the procedure, as well as the risks, benefits, and alternatives were carefully and thoroughly reviewed with the patient. Ample time for discussion and questions allowed. The patient understood, was satisfied, and agreed to proceed. #2. Trial of Bentyl as antispasmodic agent per patient request. Prescribed 20 mg every 4-6 hours when necessary for severe discomfort #3. Solid-phase gastric emptying scan #4. Refer to Dr. Erick Blinks for hemorrhoidal banding #5. Recall colonoscopy made for January 2024  A copy of this consultation note has been sent to Dr. Benjamin Stain and Dr. Rhea Belton

## 2016-10-13 ENCOUNTER — Ambulatory Visit (HOSPITAL_COMMUNITY)
Admission: RE | Admit: 2016-10-13 | Discharge: 2016-10-13 | Disposition: A | Discharge status: Home / Self Care | Payer: BLUE CROSS/BLUE SHIELD | Source: Ambulatory Visit | Attending: Internal Medicine | Admitting: Internal Medicine

## 2016-10-13 DIAGNOSIS — R1013 Epigastric pain: Secondary | ICD-10-CM

## 2016-10-13 MED ORDER — TECHNETIUM TC 99M SULFUR COLLOID
2.0000 | Freq: Once | INTRAVENOUS | Status: AC | PRN
  Administered 2016-10-13: 2 via INTRAVENOUS

## 2016-10-15 ENCOUNTER — Telehealth: Payer: Self-pay | Admitting: *Deleted

## 2016-10-15 NOTE — Telephone Encounter (Signed)
Patient has been scheduled for hemorrhoidal banding on 10/18/16.

## 2016-10-15 NOTE — Telephone Encounter (Signed)
-----   Message from Beverley Fiedler, MD sent at 10/11/2016  4:49 PM EDT ----- Yes okay for follow-up slot or wait on banding slots Ok with either JMP  ----- Message ----- From: Richardson Chiquito, CMA Sent: 10/04/2016   1:52 PM To: Beverley Fiedler, MD  Would you like me to place in follow up slot? You have no banding slots available at all. ----- Message ----- From: Beverley Fiedler, MD Sent: 10/04/2016  10:36 AM To: Richardson Chiquito, CMA  Banding referral from Katheren Shams  ----- Message ----- From: Hilarie Fredrickson, MD Sent: 10/01/2016   1:46 PM To: Beverley Fiedler, MD

## 2016-10-18 ENCOUNTER — Ambulatory Visit (INDEPENDENT_AMBULATORY_CARE_PROVIDER_SITE_OTHER): Payer: BLUE CROSS/BLUE SHIELD | Admitting: Internal Medicine

## 2016-10-18 ENCOUNTER — Encounter: Payer: Self-pay | Admitting: Internal Medicine

## 2016-10-18 VITALS — BP 122/86 | HR 72 | Ht 60.0 in | Wt 120.4 lb

## 2016-10-18 DIAGNOSIS — K642 Third degree hemorrhoids: Secondary | ICD-10-CM | POA: Diagnosis not present

## 2016-10-18 DIAGNOSIS — R151 Fecal smearing: Secondary | ICD-10-CM | POA: Diagnosis not present

## 2016-10-18 NOTE — Progress Notes (Signed)
Adriana Gordon is a 51 year old female with a past medical history of symptomatic hemorrhoids who is referred by Dr. Marina Goodell for consideration of hemorrhoidal banding  Patient states that she's had hemorrhoidal symptoms since her pregnancies. She is tried multiple over-the-counter preparations which are minimally helpful. Her primary symptoms are that of prolapse, fecal smearing and perianal irritation. She also has swelling of her perianal tissue. Very rare bleeding. Occasional itching. No prior surgical hemorrhoidal treatment  She had colonoscopy last year performed in [place] for screening. This was normal except for hemorrhoids.  PROCEDURE NOTE: The patient presents with symptomatic grade 3 internal hemorrhoids, requesting rubber band ligation of her hemorrhoidal disease.  All risks, benefits and alternative forms of therapy were described and informed consent was obtained.  In the Left Lateral Decubitus position anoscopic examination revealed grade 3 hemorrhoids in the LL>RA position(s).  The anorectum was pre-medicated with 0.125% nitroglycerin ointment The decision was made to band the left lateral (LL)  internal hemorrhoid, and the [facility] O'Regan System was used to perform band ligation without complication.  Digital anorectal examination was then performed to assure proper positioning of the band, and to adjust the banded tissue as required. The patient was discharged home without pain or other issues.  Dietary and behavioral recommendations were given and along with follow-up instructions.     The following adjunctive treatments were recommended: For occasional constipation I recommended Benefiber on a daily basis  The patient will return as scheduled for  follow-up and possible additional banding as required. No complications were encountered and the patient tolerated the procedure well.

## 2016-10-18 NOTE — Patient Instructions (Signed)

## 2016-10-22 ENCOUNTER — Encounter: Payer: Self-pay | Admitting: Sports Medicine

## 2016-10-22 DIAGNOSIS — M4322 Fusion of spine, cervical region: Secondary | ICD-10-CM

## 2016-10-27 ENCOUNTER — Encounter: Payer: Self-pay | Admitting: Rehabilitative and Restorative Service Providers"

## 2016-10-27 ENCOUNTER — Ambulatory Visit (INDEPENDENT_AMBULATORY_CARE_PROVIDER_SITE_OTHER): Payer: BLUE CROSS/BLUE SHIELD | Admitting: Rehabilitative and Restorative Service Providers"

## 2016-10-27 DIAGNOSIS — M542 Cervicalgia: Secondary | ICD-10-CM

## 2016-10-27 DIAGNOSIS — R29898 Other symptoms and signs involving the musculoskeletal system: Secondary | ICD-10-CM | POA: Diagnosis not present

## 2016-10-27 DIAGNOSIS — R293 Abnormal posture: Secondary | ICD-10-CM

## 2016-10-27 NOTE — Patient Instructions (Addendum)
Axial Extension (Chin Tuck)    Pull chin in and lengthen back of neck. Hold __5__ seconds while counting out loud. Repeat __10__ times. Do __several__ sessions per day.  Shoulder Blade Squeeze    Rotate shoulders back, then squeeze shoulder blades down and back  Can use noodle for tactile cue  Hold 10 sec Repeat __10__ times. Do _several _ sessions per day.  Upper Back Strength: Lower Trapezius / Rotator Cuff " L's "     Arms in waitress pose, palms up. Press hands back and slide shoulder blades down. Hold for __5__ seconds. Repeat _10___ times. 1-2 times per day.    Scapular Retraction: Elbow Flexion (Standing)  "W's"     With elbows bent to 90, pinch shoulder blades together and rotate arms out, keeping elbows bent. Repeat __10__ times per set. Do __1-2__ sets per session. Do _several ___ sessions per day.    Scapula Adduction With Pectoralis Stretch: Low - Standing   Shoulders at 45 hands even with shoulders, keeping weight through legs, shift weight forward until you feel pull or stretch through the front of your chest. Hold _30__ seconds. Do _3__ times, _2-4__ times per day.   Scapula Adduction With Pectoralis Stretch: Mid-Range - Standing   Shoulders at 90 elbows even with shoulders, keeping weight through legs, shift weight forward until you feel pull or strength through the front of your chest. Hold __30_ seconds. Do _3__ times, __2-4_ times per day.   Scapula Adduction With Pectoralis Stretch: High - Standing   Shoulders at 120 hands up high on the doorway, keeping weight on feet, shift weight forward until you feel pull or stretch through the front of your chest. Hold _30__ seconds. Do _3__ times, _2-3__ times per day.   TENS UNIT: This is helpful for muscle pain and spasm.   Search and Purchase a TENS 7000 2nd edition at www.tenspros.com. It should be less than $30.     TENS unit instructions: Do not shower or bathe with the unit on Turn  the unit off before removing electrodes or batteries If the electrodes lose stickiness add a drop of water to the electrodes after they are disconnected from the unit and place on plastic sheet. If you continued to have difficulty, call the TENS unit company to purchase more electrodes. Do not apply lotion on the skin area prior to use. Make sure the skin is clean and dry as this will help prolong the life of the electrodes. After use, always check skin for unusual red areas, rash or other skin difficulties. If there are any skin problems, does not apply electrodes to the same area. Never remove the electrodes from the unit by pulling the wires. Do not use the TENS unit or electrodes other than as directed. Do not change electrode placement without consultating your therapist or physician. Keep 2 fingers with between each electrode.    Trigger Point Dry Needling  . What is Trigger Point Dry Needling (DN)? o DN is a physical therapy technique used to treat muscle pain and dysfunction. Specifically, DN helps deactivate muscle trigger points (muscle knots).  o A thin filiform needle is used to penetrate the skin and stimulate the underlying trigger point. The goal is for a local twitch response (LTR) to occur and for the trigger point to relax. No medication of any kind is injected during the procedure.   . What Does Trigger Point Dry Needling Feel Like?  o The procedure feels different for each individual  patient. Some patients report that they do not actually feel the needle enter the skin and overall the process is not painful. Very mild bleeding may occur. However, many patients feel a deep cramping in the muscle in which the needle was inserted. This is the local twitch response.   Marland Kitchen How Will I feel after the treatment? o Soreness is normal, and the onset of soreness may not occur for a few hours. Typically this soreness does not last longer than two days.  o Bruising is uncommon, however;  ice can be used to decrease any possible bruising.  o In rare cases feeling tired or nauseous after the treatment is normal. In addition, your symptoms may get worse before they get better, this period will typically not last longer than 24 hours.   . What Can I do After My Treatment? o Increase your hydration by drinking more water for the next 24 hours. o You may place ice or heat on the areas treated that have become sore, however, do not use heat on inflamed or bruised areas. Heat often brings more relief post needling. o You can continue your regular activities, but vigorous activity is not recommended initially after the treatment for 24 hours. o DN is best combined with other physical therapy such as strengthening, stretching, and other therapies.

## 2016-10-27 NOTE — Therapy (Addendum)
[facility] [place] [address] [place], [place], [address] Phone: [phone]   Fax:  [phone]  Physical Therapy Evaluation  Patient Details  Name: Adriana Gordon MRN: [id number] Date of Birth: [birth year] Referring Provider: Dr Benjamin Stain  Encounter Date: 10/27/2016      PT End of Session - 10/27/16 1212    Visit Number 1   Number of Visits 12   Date for PT Re-Evaluation 12/07/16   PT Start Time 0845   PT Stop Time 0954   PT Time Calculation (min) 69 min   Activity Tolerance Patient tolerated treatment well      Past Medical History:  Diagnosis Date  . Allergic rhinitis   . Asthma    childhood  . External hemorrhoids   . History of menorrhagia   . 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 Ovarian cyst     Past Surgical History:  Procedure Laterality Date  . BACK SURGERY    . BREAST SURGERY     reduction  . CESAREAN SECTION    . COLONOSCOPY  2017  . ENDOMETRIAL ABLATION  2006  . REDUCTION MAMMAPLASTY    . TONSILLECTOMY      There were no vitals filed for this visit.       Subjective Assessment - 10/27/16 0850    Subjective Amri reports that she has cervical fusion 2013 and has been having trouble with neck in the past 2 weeks. She has increase in symtpoms with UE use and certain positions. Radicular symptoms into bilat UE's on an intermittent basis since surgery - numbness at night at times.    Pertinent History C5/6; C6/7 fusion 2013 GI problems - thought to be muscular and treated with PT/DN in the past; Lt thumb CMC arthritis    How long can you sit comfortably? 1-2 hours    How long can you stand comfortably? 1-2 hours    How long can you walk comfortably? 1-2 hours    Diagnostic tests xrays    Patient Stated Goals to get rid of pain and return to normal activities    Currently in Pain? Yes   Pain Score 4    Pain Location Neck   Pain Orientation Left;Right;Lower;Mid;Upper   Pain  Descriptors / Indicators Tightness;Spasm;Burning   Pain Type Acute pain;Chronic pain   Pain Radiating Towards into the head - headache; into shoudlers and arms; between shoulder blades bilat    Pain Onset 1 to 4 weeks ago   Pain Frequency Constant   Aggravating Factors  use of UE's; driving; reading; crocheting; holding phone    Pain Relieving Factors meds; ice; heat; stretching; massage             [facility] PT Assessment - 10/27/16 0001      Assessment   Medical Diagnosis Cervical dysfunction    Referring Provider Dr Benjamin Stain   Onset Date/Surgical Date 10/14/16   Hand Dominance Right   Next MD Visit PRN    Prior Therapy PT after surgery and periodically after the surgery last time ~ 2 years ago      Balance Screen   Has the patient fallen in the past 6 months No   Has the patient had a decrease in activity level because of a fear of falling?  No   Is the patient reluctant to leave their home because of a fear of falling?  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      Observation/Other Assessments   Focus on Therapeutic Outcomes (FOTO)  43% limitation      Sensation   Additional Comments intermittnet tingling bilat UE''s mostly lateral side of forearms and lateral hand/fingers      Posture/Postural Control   Posture Comments head forward; shoudlers rounded and elevated; head of the humerus anterior in orientation; scapulae abducted and rotated along the thoracic wall      AROM   Right/Left Shoulder --  tight end ranges elevation Lt > Rt    Cervical Flexion 42   Cervical Extension 44   Cervical - Right Side Bend 40   Cervical - Left Side Bend 39   Cervical - Right Rotation 64   Cervical - Left Rotation 61     Strength   Overall Strength Comments WFL's except weakness Lt > Rt middle and lower trap       Palpation   Spinal mobility tight cervical and thoracic CPA and lateral glides    Palpation comment muscular tightness bilat pecs; upper traps; leveator; deltoid; ant/lat/post cervical musculature             Objective measurements completed on examination: See above findings.          [facility] Adult PT Treatment/Exercise - 10/27/16 0001      Neuro Re-ed    Neuro Re-ed Details  initiated postural correction      Neck Exercises: Standing   Neck Retraction 5 reps;10 secs  with noodle      Shoulder Exercises: Standing   Other Standing Exercises scap squeeze with noodle 10 sec x 10      Shoulder Exercises: Stretch   Other Shoulder Stretches 3 way doorway stretch(some difficulty with middle position - to try to pt tolerance) 2 reps x 30 sec      Moist Heat Therapy   Number Minutes Moist Heat 20 Minutes   Moist Heat Location Cervical;Shoulder  bilat      Electrical Stimulation   Electrical Stimulation Location bilat cervical and upper traps    Electrical Stimulation Action IFC   Electrical Stimulation Parameters to tolerance    Electrical Stimulation Goals Pain;Tone     Manual Therapy   Manual therapy comments pt supine    Joint Mobilization gentle CPA mobs through upper thoracic spine with pt supine    Soft tissue mobilization ant/lat/post cervical musculature; upper traps bilat    Myofascial Release upper traps       Treatment included DN to SCM/upper trap musculature bilat Written info provided consent given  Tolerated DN well(has had needling done in past)           PT Education - 10/27/16 0931    Education provided Yes   Education Details HEP TENS DN    Person(s) Educated Patient   Methods Explanation;Demonstration;Tactile cues;Verbal cues;Handout   Comprehension Verbalized understanding;Returned demonstration;Verbal cues required;Tactile cues required             PT Long Term Goals - 10/27/16 1220      PT LONG TERM GOAL #1   Title Improve  posture and alignment with patient to demonstrated upright posture engaging posterior shoudler girdle musculature 12/07/16   Time 6   Period Weeks   Status New     PT LONG TERM GOAL #2   Title Patient reports 50-75% decrease in cervical pain and  radicular symptoms 12/07/16   Time 6   Period Weeks   Status New     PT LONG TERM GOAL #3   Title Increase cervical ROM by 5-8 degrees in all palnes 12/07/16   Time 6   Period Weeks   Status New     PT LONG TERM GOAL #4   Title Independent in HEP 12/07/16   Time 6   Period Weeks   Status New     PT LONG TERM GOAL #5   Title Improve FOTO to </= 26% limitation 12/07/16   Time 6   Period Weeks   Status New                Plan - 10/27/16 1213    Clinical Impression Statement Kelseigh presents with recurrent cervical pain with bilat UE radicular symptoms. Current flare up has been present for a couple of weeks and pt feels is related to increased forward postures with crocheting and other forward activities using UE's. She presents with poor cervical and thoracic posture and alignment; significant tightness through the pecs creating forward posture and muscular imbalance. She has muscular tightness through cervical and shoudler girdle musculature; limited cervical and shoulder ROM/mobilty; intermittent numbness and tingling in bilat UE's; decresed postural strength; pain on a constant basis; intermittent headaches. Patient will benefit from PT to address problems identified.    History and Personal Factors relevant to plan of care: has had dry needling in the past and responded well to treatment; cervical fusion 2 levels 2013    Clinical Presentation Evolving   Clinical Decision Making Low   Rehab Potential Good   PT Frequency 2x / week   PT Duration 6 weeks   PT Treatment/Interventions Patient/family education;ADLs/Self Care Home Management;Cryotherapy;Electrical Stimulation;Iontophoresis /ml Dexamethasone;Moist Heat;Ultrasound;Dry  needling;Manual techniques;Therapeutic activities;Therapeutic exercise;Neuromuscular re-education   Consulted and Agree with Plan of Care Patient      Patient will benefit from skilled therapeutic intervention in order to improve the following deficits and impairments:  Postural dysfunction, Improper body mechanics, Increased fascial restricitons, Increased muscle spasms, Decreased mobility, Decreased range of motion, Decreased activity tolerance  Visit Diagnosis: Cervicalgia - Plan: PT plan of care cert/re-cert  Other symptoms and signs involving the musculoskeletal system - Plan: PT plan of care cert/re-cert  Abnormal posture - Plan: PT plan of care cert/re-cert     Problem List Patient Active Problem List   Diagnosis Date Noted  . Borderline hypercholesterolemia 07/15/2016  . Skin lesion of chest wall 07/15/2016  . Mass of uterine cervix 07/14/2016  . Acute epigastric pain 07/13/2016    Arkeem Harts Rober Minion PT, MPH  10/27/2016, 12:30 PM  [facility] [address] [place], [place], [address] Phone: [phone]   Fax:  [phone]  Name: Adriana Gordon MRN: [id number] Date of Birth: 10/03/1965

## 2016-10-28 ENCOUNTER — Other Ambulatory Visit: Payer: Self-pay | Admitting: Internal Medicine

## 2016-10-29 ENCOUNTER — Encounter: Payer: Self-pay | Admitting: Rehabilitative and Restorative Service Providers"

## 2016-10-29 ENCOUNTER — Ambulatory Visit (INDEPENDENT_AMBULATORY_CARE_PROVIDER_SITE_OTHER): Payer: BLUE CROSS/BLUE SHIELD | Admitting: Rehabilitative and Restorative Service Providers"

## 2016-10-29 ENCOUNTER — Ambulatory Visit: Payer: BLUE CROSS/BLUE SHIELD | Admitting: Rehabilitative and Restorative Service Providers"

## 2016-10-29 DIAGNOSIS — R29898 Other symptoms and signs involving the musculoskeletal system: Secondary | ICD-10-CM | POA: Diagnosis not present

## 2016-10-29 DIAGNOSIS — R293 Abnormal posture: Secondary | ICD-10-CM | POA: Diagnosis not present

## 2016-10-29 DIAGNOSIS — M542 Cervicalgia: Secondary | ICD-10-CM

## 2016-10-29 NOTE — Patient Instructions (Signed)

## 2016-10-29 NOTE — Therapy (Signed)
[facility] [place] [address] [place], [place], [address] Phone: [phone]   Fax:  [phone]  Physical Therapy Treatment  Patient Details  Name: Adriana Gordon MRN: [id number] Date of Birth: April 27, 1965 Referring Provider: Dr Benjamin Stain  Encounter Date: 10/29/2016      PT End of Session - 10/29/16 0941    Visit Number 2   Number of Visits 12   Date for PT Re-Evaluation 12/07/16   PT Start Time 0931   PT Stop Time 1028   PT Time Calculation (min) 57 min   Activity Tolerance Patient tolerated treatment well      Past Medical History:  Diagnosis Date  . Allergic rhinitis   . Asthma    childhood  . External hemorrhoids   . History of menorrhagia   . 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 Ovarian cyst     Past Surgical History:  Procedure Laterality Date  . BACK SURGERY    . BREAST SURGERY     reduction  . CESAREAN SECTION    . COLONOSCOPY  2017  . ENDOMETRIAL ABLATION  2006  . REDUCTION MAMMAPLASTY    . TONSILLECTOMY      There were no vitals filed for this visit.      Subjective Assessment - 10/29/16 0956    Subjective Some improvement following initial visit and DN. Less HA. Working on the exercises at home. Has never stretched pecs before. Can feel some tightness and soreness form DN and exercises but improving.    Currently in Pain? Yes   Pain Score 4    Pain Location Neck   Pain Orientation Right;Left;Lower;Mid;Upper   Pain Descriptors / Indicators Tightness;Spasm;Burning   Pain Type Acute pain;Chronic pain                         [facility] Adult PT Treatment/Exercise - 10/29/16 0001      Neck Exercises: Standing   Neck Retraction 5 reps;10 secs  with noodle      Shoulder Exercises: Standing   Other Standing Exercises scap squeeze with noodle 10 sec x 10    Other Standing Exercises L's x 10; W's x 10 with noodle      Shoulder Exercises: Stretch   Other  Shoulder Stretches 3 way doorway stretch(some difficulty with middle position - to try to pt tolerance) 2 reps x 30 sec      Manual Therapy   Manual therapy comments pt prone and supine    Joint Mobilization gentle CPA mobs through upper thoracic spine with pt supine   mindful of cervical disc surgery lower cervical    Soft tissue mobilization ant/lat/post cervical musculature; upper traps bilat    Myofascial Release upper traps      Neck Exercises: Stretches   Other Neck Stretches axial extension supine 10 sec x 5 on pillow           Trigger Point Dry Needling - 10/29/16 1011    Consent Given? Yes   Education Handout Provided Yes   Muscles Treated Upper Body Oblique capitus;Upper trapezius  bilat - with estim    Upper Trapezius Response Palpable increased muscle length;Twitch reponse elicited   Oblique Capitus Response Palpable increased muscle length   Longissimus Response Palpable increased muscle length  cervical               PT Education - 10/29/16 1013    Education provided Yes  Education Details HEP DN    Person(s) Educated Patient   Methods Explanation;Demonstration;Tactile cues;Verbal cues;Handout   Comprehension Verbalized understanding;Returned demonstration;Verbal cues required;Tactile cues required             PT Long Term Goals - 10/27/16 1220      PT LONG TERM GOAL #1   Title Improve posture and alignment with patient to demonstrated upright posture engaging posterior shoudler girdle musculature 12/07/16   Time 6   Period Weeks   Status New     PT LONG TERM GOAL #2   Title Patient reports 50-75% decrease in cervical pain and radicular symptoms 12/07/16   Time 6   Period Weeks   Status New     PT LONG TERM GOAL #3   Title Increase cervical ROM by 5-8 degrees in all palnes 12/07/16   Time 6   Period Weeks   Status New     PT LONG TERM GOAL #4   Title Independent in HEP 12/07/16   Time 6   Period Weeks   Status New     PT LONG  TERM GOAL #5   Title Improve FOTO to </= 26% limitation 12/07/16   Time 6   Period Weeks   Status New               Plan - 10/29/16 1013    Clinical Impression Statement Good response to initial treatment with decreased tightness and headache. Tolerated exercise and DN/manual work well. Progressing toward goals. Needs continued work on anterior chest stretching and posterior shoulder girdle strengthening to further correct posture.   Rehab Potential Good   PT Frequency 2x / week   PT Duration 6 weeks   PT Treatment/Interventions Patient/family education;ADLs/Self Care Home Management;Cryotherapy;Electrical Stimulation;Iontophoresis /ml Dexamethasone;Moist Heat;Ultrasound;Dry needling;Manual techniques;Therapeutic activities;Therapeutic exercise;Neuromuscular re-education   PT Next Visit Plan review HEP; progress pec stretching; axial extension; continue with DN/estim; manual work and modalities as indicated    Consulted and Agree with Plan of Care Patient      Patient will benefit from skilled therapeutic intervention in order to improve the following deficits and impairments:  Postural dysfunction, Improper body mechanics, Increased fascial restricitons, Increased muscle spasms, Decreased mobility, Decreased range of motion, Decreased activity tolerance  Visit Diagnosis: Cervicalgia  Other symptoms and signs involving the musculoskeletal system  Abnormal posture     Problem List Patient Active Problem List   Diagnosis Date Noted  . Borderline hypercholesterolemia 07/15/2016  . Skin lesion of chest wall 07/15/2016  . Mass of uterine cervix 07/14/2016  . Acute epigastric pain 07/13/2016    Adriana Gordon Rober Minion PT, MPH  10/29/2016, 10:16 AM  [facility] [address] [place], [place], [address] Phone: [phone]   Fax:  [phone]  Name: Adriana Gordon MRN: [id number] Date of Birth: November 06, 1965

## 2016-11-03 ENCOUNTER — Encounter: Payer: Self-pay | Admitting: Physical Therapy

## 2016-11-03 ENCOUNTER — Ambulatory Visit (INDEPENDENT_AMBULATORY_CARE_PROVIDER_SITE_OTHER): Payer: BLUE CROSS/BLUE SHIELD | Admitting: Physical Therapy

## 2016-11-03 DIAGNOSIS — M542 Cervicalgia: Secondary | ICD-10-CM | POA: Diagnosis not present

## 2016-11-03 DIAGNOSIS — R29898 Other symptoms and signs involving the musculoskeletal system: Secondary | ICD-10-CM | POA: Diagnosis not present

## 2016-11-03 DIAGNOSIS — R293 Abnormal posture: Secondary | ICD-10-CM

## 2016-11-03 NOTE — Therapy (Addendum)
[facility] [place] [address] [place], [place], [address] Phone: [phone]   Fax:  [phone]  Physical Therapy Treatment  Patient Details  Name: Adriana Gordon MRN: [id number] Date of Birth: 05-28-1965 Referring Provider: Dr Benjamin Stain  Encounter Date: 11/03/2016      PT End of Session - 11/03/16 1059    Visit Number 3   Number of Visits 12   Date for PT Re-Evaluation 12/07/16   PT Start Time 1100   PT Stop Time 1201   PT Time Calculation (min) 61 min   Activity Tolerance Patient tolerated treatment well      Past Medical History:  Diagnosis Date  . Allergic rhinitis   . Asthma    childhood  . External hemorrhoids   . History of menorrhagia   . 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 Ovarian cyst     Past Surgical History:  Procedure Laterality Date  . BACK SURGERY    . BREAST SURGERY     reduction  . CESAREAN SECTION    . COLONOSCOPY  2017  . ENDOMETRIAL ABLATION  2006  . REDUCTION MAMMAPLASTY    . TONSILLECTOMY      There were no vitals filed for this visit.      Subjective Assessment - 11/03/16 1102    Subjective Adriana Gordon reports her Rt side is more loose than the left, She feels like she is aboug 35% improved.    Currently in Pain? Yes   Pain Score 2    Pain Location Shoulder   Pain Orientation Left   Pain Descriptors / Indicators Aching;Burning;Shooting;Sore   Pain Type Chronic pain;Acute pain   Aggravating Factors  depends on what she is doing, worse at end of day.    Pain Relieving Factors heat, ice & stretches.                          [facility] Adult PT Treatment/Exercise - 11/03/16 0001      Modalities   Modalities Electrical Stimulation;Moist Heat     Moist Heat Therapy   Number Minutes Moist Heat 15 Minutes   Moist Heat Location --  thoracic     Electrical Stimulation   Electrical Stimulation Location thoracic paraspinals   Electrical Stimulation  Action IFC   Electrical Stimulation Parameters  to tolerance   Electrical Stimulation Goals Pain;Tone     Manual Therapy   Manual therapy comments Pt prone and s/l   Soft tissue mobilization STM to Lt rhomboids, upper traps.levators all left side.           Trigger Point Dry Needling - 11/03/16 1504    Consent Given? Yes   Education Handout Provided No   Muscles Treated Upper Body Upper trapezius;Levator scapulae;Rhomboids;Subscapularis   Upper Trapezius Response Palpable increased muscle length;Twitch reponse elicited  bilat in supine   Levator Scapulae Response Palpable increased muscle length;Twitch response elicited  Lt in s/l   Rhomboids Response Palpable increased muscle length;Twitch response elicited  Lt, prone   Subscapularis Response Palpable increased muscle length;Twitch response elicited  Lt, in s/l                   PT Long Term Goals - 10/27/16 1220      PT LONG TERM GOAL #1   Title Improve posture and alignment with patient to demonstrated upright posture engaging posterior shoudler girdle musculature 12/07/16   Time 6   Period  Weeks   Status New     PT LONG TERM GOAL #2   Title Patient reports 50-75% decrease in cervical pain and radicular symptoms 12/07/16   Time 6   Period Weeks   Status New     PT LONG TERM GOAL #3   Title Increase cervical ROM by 5-8 degrees in all palnes 12/07/16   Time 6   Period Weeks   Status New     PT LONG TERM GOAL #4   Title Independent in HEP 12/07/16   Time 6   Period Weeks   Status New     PT LONG TERM GOAL #5   Title Improve FOTO to </= 26% limitation 12/07/16   Time 6   Period Weeks   Status New               Plan - 11/03/16 1154    Clinical Impression Statement Adriana Gordon is having good response to PT, she feels overall 35% improved and is progressing to all her goals.  She sontinues to have tightness in the upper body.    Rehab Potential Good   PT Frequency 2x / week   PT Duration 6 weeks    PT Treatment/Interventions Patient/family education;ADLs/Self Care Home Management;Cryotherapy;Electrical Stimulation;Iontophoresis 4mg /ml Dexamethasone;Moist Heat;Ultrasound;Dry needling;Manual techniques;Therapeutic activities;Therapeutic exercise;Neuromuscular re-education   PT Next Visit Plan review HEP; progress pec stretching; axial extension; continue with DN/estim; manual work and modalities as indicated    Consulted and Agree with Plan of Care Patient      Patient will benefit from skilled therapeutic intervention in order to improve the following deficits and impairments:  Postural dysfunction, Improper body mechanics, Increased fascial restricitons, Increased muscle spasms, Decreased mobility, Decreased range of motion, Decreased activity tolerance  Visit Diagnosis: Abnormal posture  Other symptoms and signs involving the musculoskeletal system  Cervicalgia     Problem List Patient Active Problem List   Diagnosis Date Noted  . Borderline hypercholesterolemia 07/15/2016  . Skin lesion of chest wall 07/15/2016  . Mass of uterine cervix 07/14/2016  . Acute epigastric pain 07/13/2016    Adriana Gordon PT  11/03/2016, 3:06 PM  [facility] [address] [place], [place], [address] Phone: [phone]   Fax:  [phone]  Name: Adriana Gordon MRN: [id number] Date of Birth: [birth year]

## 2016-11-05 ENCOUNTER — Ambulatory Visit (INDEPENDENT_AMBULATORY_CARE_PROVIDER_SITE_OTHER): Payer: BLUE CROSS/BLUE SHIELD | Admitting: Rehabilitative and Restorative Service Providers"

## 2016-11-05 ENCOUNTER — Encounter: Payer: Self-pay | Admitting: Rehabilitative and Restorative Service Providers"

## 2016-11-05 DIAGNOSIS — M542 Cervicalgia: Secondary | ICD-10-CM | POA: Diagnosis not present

## 2016-11-05 DIAGNOSIS — R293 Abnormal posture: Secondary | ICD-10-CM | POA: Diagnosis not present

## 2016-11-05 DIAGNOSIS — R29898 Other symptoms and signs involving the musculoskeletal system: Secondary | ICD-10-CM | POA: Diagnosis not present

## 2016-11-05 NOTE — Therapy (Signed)
[facility] [address] [place], [place], [address] Phone: [phone]   Fax:  [phone]  Physical Therapy Treatment  Patient Details  Name: Adriana Gordon MRN: [id number] Date of Birth: [birth year] Referring Provider: Dr Benjamin Stainhekkekandam  Encounter Date: 11/05/2016      PT End of Session - 11/05/16 1107    Visit Number 4   Number of Visits 12   Date for PT Re-Evaluation 12/07/16   PT Start Time 1105   PT Stop Time 1202   PT Time Calculation (min) 57 min   Activity Tolerance Patient tolerated treatment well      Past Medical History:  Diagnosis Date  . Allergic rhinitis   . Asthma    childhood  . External hemorrhoids   . History of menorrhagia   . 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 Ovarian cyst     Past Surgical History:  Procedure Laterality Date  . BACK SURGERY    . BREAST SURGERY     reduction  . CESAREAN SECTION    . COLONOSCOPY  2017  . ENDOMETRIAL ABLATION  2006  . REDUCTION MAMMAPLASTY    . TONSILLECTOMY      There were no vitals filed for this visit.      Subjective Assessment - 11/05/16 1108    Subjective Improving. Less pain. Still awakening with headache in the morning but this resolves in 30-40 min. Pain is now generally 3-4/10 which for her is good. Can function with this amount of pain    Currently in Pain? Yes   Pain Score 3    Pain Location Shoulder   Pain Orientation Left   Pain Descriptors / Indicators Aching;Burning;Sore   Pain Type Chronic pain;Acute pain   Pain Onset 1 to 4 weeks ago   Pain Frequency Constant                         [facility] Adult PT Treatment/Exercise - 11/05/16 0001      Neuro Re-ed    Neuro Re-ed Details  working on posture and alignment with improved position of the heand and neck and engagement of posterior shoulder girdle musculature      Shoulder Exercises: Prone   Other Prone Exercises W's in extension x 10 some  upper trap discomfort      Shoulder Exercises: Standing   Extension Strengthening;Both;10 reps;Theraband   Theraband Level (Shoulder Extension) Level 2 (Red)   Row Strengthening;Both;10 reps;Theraband   Theraband Level (Shoulder Row) Level 2 (Red)   Retraction Strengthening;Both;10 reps;Theraband   Theraband Level (Shoulder Retraction) Level 1 (Yellow)   Other Standing Exercises scap squeeze with noodle 10 sec x 10    Other Standing Exercises L's x 10; W's x 10 with noodle      Shoulder Exercises: Stretch   Other Shoulder Stretches 3 way doorway stretch- 2 reps x 30 sec      Moist Heat Therapy   Number Minutes Moist Heat 20 Minutes   Moist Heat Location Cervical  thoracic     Electrical Stimulation   Electrical Stimulation Location thoracic paraspinals; Lt posterior cervical; Rt upper trap    Electrical Stimulation Action IFC   Electrical Stimulation Parameters to tolerance   Electrical Stimulation Goals Pain;Tone     Manual Therapy   Manual therapy comments Pt prone and supine    Soft tissue mobilization soft tissue work to MotorolaLt thoracic paraspinals/lower and middle traps/rhomboids; bilat upper traps;  posterior cervical/occipital area    Myofascial Release thoracic paraspinals Lt           Trigger Point Dry Needling - 11/05/16 1306    Consent Given? Yes   Muscles Treated Upper Body --  e-stim with DN   Upper Trapezius Response Palpable increased muscle length  bilat    SubOccipitals Response Palpable increased muscle length  bilat    Longissimus Response Palpable increased muscle length  Lt thoracic paraspinals               PT Education - 11/05/16 1123    Education provided Yes   Education Details HEP    Person(s) Educated Patient   Methods Explanation;Demonstration;Tactile cues;Verbal cues;Handout   Comprehension Verbalized understanding;Returned demonstration;Verbal cues required;Tactile cues required             PT Long Term Goals - 11/05/16 1110       PT LONG TERM GOAL #1   Title Improve posture and alignment with patient to demonstrated upright posture engaging posterior shoudler girdle musculature 12/07/16   Time 6   Period Weeks   Status On-going     PT LONG TERM GOAL #2   Title Patient reports 50-75% decrease in cervical pain and radicular symptoms 12/07/16   Time 6   Period Weeks   Status On-going     PT LONG TERM GOAL #3   Title Increase cervical ROM by 5-8 degrees in all palnes 12/07/16   Time 6   Period Weeks   Status On-going     PT LONG TERM GOAL #4   Title Independent in HEP 12/07/16   Time 6   Period Weeks   Status On-going     PT LONG TERM GOAL #5   Title Improve FOTO to </= 26% limitation 12/07/16   Time 6   Period Weeks   Status On-going               Plan - 11/05/16 1112    Clinical Impression Statement Gradually imoproving. Has not returned to base line chronic symptoms. She continues to have some symptoms on a constant basis - neck/shoulder/arms.    Rehab Potential Good   PT Frequency 2x / week   PT Duration 6 weeks   PT Treatment/Interventions Patient/family education;ADLs/Self Care Home Management;Cryotherapy;Electrical Stimulation;Iontophoresis 4mg /ml Dexamethasone;Moist Heat;Ultrasound;Dry needling;Manual techniques;Therapeutic activities;Therapeutic exercise;Neuromuscular re-education   PT Next Visit Plan review HEP; progress pec stretching; axial extension; continue with DN/estim; manual work and modalities as indicated    Consulted and Agree with Plan of Care Patient      Patient will benefit from skilled therapeutic intervention in order to improve the following deficits and impairments:  Postural dysfunction, Improper body mechanics, Increased fascial restricitons, Increased muscle spasms, Decreased mobility, Decreased range of motion, Decreased activity tolerance  Visit Diagnosis: Abnormal posture  Other symptoms and signs involving the musculoskeletal  system  Cervicalgia     Problem List Patient Active Problem List   Diagnosis Date Noted  . Borderline hypercholesterolemia 07/15/2016  . Skin lesion of chest wall 07/15/2016  . Mass of uterine cervix 07/14/2016  . Acute epigastric pain 07/13/2016    Antwoine Zorn Rober Minion PT, MPH  11/05/2016, 1:09 PM  [facility] [address] [place], [place], [address] Phone: [phone]   Fax:  [phone]  Name: Adriana Gordon MRN: [id number] Date of Birth: 09-14-1965

## 2016-11-05 NOTE — Patient Instructions (Signed)
Resisted External Rotation: in Neutral - Bilateral   PALMS UP Sit or stand, tubing in both hands, elbows at sides, bent to 90, forearms forward. Pinch shoulder blades together and rotate forearms out. Keep elbows at sides. Repeat __10__ times per set. Do _2-3___ sets per session. Do _2-3___ sessions per day.   Low Row: Standing   Face anchor, feet shoulder width apart. Palms up, pull arms back, squeezing shoulder blades together. Repeat 10__ times per set. Do 2-3__ sets per session. Do 2-3__ sessions per day. Anchor Height: Waist     Strengthening: Resisted Extension   Hold tubing in right hand, arm forward. Pull arm back, elbow straight. Repeat _10___ times per set. Do 2-3____ sets per session. Do 2-3____ sessions per day.   

## 2016-11-09 ENCOUNTER — Ambulatory Visit (INDEPENDENT_AMBULATORY_CARE_PROVIDER_SITE_OTHER): Payer: BLUE CROSS/BLUE SHIELD | Admitting: Physical Therapy

## 2016-11-09 ENCOUNTER — Encounter: Payer: Self-pay | Admitting: Physical Therapy

## 2016-11-09 DIAGNOSIS — M542 Cervicalgia: Secondary | ICD-10-CM | POA: Diagnosis not present

## 2016-11-09 DIAGNOSIS — R29898 Other symptoms and signs involving the musculoskeletal system: Secondary | ICD-10-CM

## 2016-11-09 DIAGNOSIS — R293 Abnormal posture: Secondary | ICD-10-CM

## 2016-11-09 NOTE — Therapy (Signed)
[place], [place], [address] Phone: [phone]   Fax:  [phone]  Physical Therapy Treatment  Patient Details  Name: Adriana Gordon MRN: [id number] Date of Birth: 1965/04/02 Referring Provider: Dr Dianah Field  Encounter Date: 11/09/2016      PT End of Session - 11/09/16 0808    Visit Number 5   Number of Visits 12   Date for PT Re-Evaluation 12/07/16   PT Start Time 0809   PT Stop Time 0906   PT Time Calculation (min) 57 min   Activity Tolerance Patient tolerated treatment well      Past Medical History:  Diagnosis Date  . Allergic rhinitis   . Asthma    childhood  . External hemorrhoids   . History of menorrhagia   . 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 Ovarian cyst     Past Surgical History:  Procedure Laterality Date  . BACK SURGERY    . BREAST SURGERY     reduction  . CESAREAN SECTION    . COLONOSCOPY  2017  . ENDOMETRIAL ABLATION  2006  . REDUCTION MAMMAPLASTY    . TONSILLECTOMY      There were no vitals filed for this visit.      Subjective Assessment - 11/09/16 0811    Subjective Pt reports her pain has been good, was busy over the weekend so didn't stretch as much as she should have.  New ex with band going well. Today is a better day.    Patient Stated Goals to get rid of pain and return to normal activities    Currently in Pain? Yes   Pain Score 2    Pain Location Neck   Pain Orientation Left   Pain Descriptors / Indicators Tightness;Aching   Pain Type Chronic pain   Pain Onset More than a month ago   Pain Frequency Constant   Aggravating Factors  driving  - holding arms up, writing   Pain Relieving Factors heat, ice and stretches, meds            [facility] PT Assessment - 11/09/16 0001      Assessment   Medical Diagnosis Cervical dysfunction      AROM   Cervical Flexion 46   Cervical Extension 53   Cervical - Right Rotation 70    Cervical - Left Rotation 76     Palpation   Spinal mobility hypomobile in C spine, better in thoracic   Palpation comment banding in th Lt upper trap/levator                     [facility] Adult PT Treatment/Exercise - 11/09/16 0001      Exercises   Exercises Neck     Neck Exercises: Machines for Strengthening   UBE (Upper Arm Bike) L2x 3' BWD     Neck Exercises: Supine   Neck Retraction 20 reps  into ball off EOB   Other Supine Exercise 20 reps SASH with yellow band.   VC for form     Modalities   Modalities Electrical Stimulation;Moist Heat     Moist Heat Therapy   Number Minutes Moist Heat 20 Minutes   Moist Heat Location Cervical  thoracic     Electrical Stimulation   Electrical Stimulation Action IFC   Electrical Stimulation Parameters to tolerance   Electrical Stimulation Goals Pain;Tone     Manual Therapy   Manual Therapy Soft tissue mobilization;Joint  mobilization   Manual therapy comments Pt prone   Joint Mobilization cervical and thoracic mobs, hypomobile in cervical, pain with CPA C2&4 and Lt UPA C2-4   Soft tissue mobilization STM to bila upperr taps and Lt levator.           Trigger Point Dry Needling - 11/09/16 8177    Consent Given? Yes   Education Handout Provided No                   PT Long Term Goals - 11/09/16 0841      PT LONG TERM GOAL #1   Title Improve posture and alignment with patient to demonstrated upright posture engaging posterior shoudler girdle musculature 12/07/16   Status On-going     PT LONG TERM GOAL #2   Title Patient reports 50-75% decrease in cervical pain and radicular symptoms 12/07/16   Status Partially Met  50% improvement     PT LONG TERM GOAL #3   Title Increase cervical ROM by 5-8 degrees in all palnes 12/07/16   Status Partially Met     PT LONG TERM GOAL #4   Title Independent in HEP 12/07/16   Status On-going     PT LONG TERM GOAL #5   Title Improve FOTO to </= 26% limitation  12/07/16   Status On-going               Plan - 11/09/16 0925    Clinical Impression Statement Shaylynne continues to make improvement with pain reduction and had increased cervical ROM today as well. She has partially met her goals.  There is still palpable bands in her Lt upper trap and levator along with hypomobility in the cervical spine.  Her thoracic spine is moving better as compared to last week.    Rehab Potential Good   PT Frequency 2x / week   PT Treatment/Interventions Patient/family education;ADLs/Self Care Home Management;Cryotherapy;Electrical Stimulation;Iontophoresis 80m/ml Dexamethasone;Moist Heat;Ultrasound;Dry needling;Manual techniques;Therapeutic activities;Therapeutic exercise;Neuromuscular re-education   PT Next Visit Plan cont manual work, spinals mobs, STW and DN   Consulted and Agree with Plan of Care Patient      Patient will benefit from skilled therapeutic intervention in order to improve the following deficits and impairments:  Postural dysfunction, Improper body mechanics, Increased fascial restricitons, Increased muscle spasms, Decreased mobility, Decreased range of motion, Decreased activity tolerance  Visit Diagnosis: Abnormal posture  Other symptoms and signs involving the musculoskeletal system  Cervicalgia     Problem List Patient Active Problem List   Diagnosis Date Noted  . Borderline hypercholesterolemia 07/15/2016  . Skin lesion of chest wall 07/15/2016  . Mass of uterine cervix 07/14/2016  . Acute epigastric pain 07/13/2016    SJeral PinchPT  11/09/2016, 9:30 AM  [facility] [address]Phone: [phone]  Fax:  [phone] Name: EThatiana RenbargerMRN: [id number] of Birth: 112-Jul-1967

## 2016-11-10 ENCOUNTER — Encounter: Payer: BLUE CROSS/BLUE SHIELD | Admitting: Rehabilitative and Restorative Service Providers"

## 2016-11-12 ENCOUNTER — Ambulatory Visit (INDEPENDENT_AMBULATORY_CARE_PROVIDER_SITE_OTHER): Payer: BLUE CROSS/BLUE SHIELD | Admitting: Rehabilitative and Restorative Service Providers"

## 2016-11-12 ENCOUNTER — Encounter: Payer: Self-pay | Admitting: Rehabilitative and Restorative Service Providers"

## 2016-11-12 DIAGNOSIS — R29898 Other symptoms and signs involving the musculoskeletal system: Secondary | ICD-10-CM | POA: Diagnosis not present

## 2016-11-12 DIAGNOSIS — R293 Abnormal posture: Secondary | ICD-10-CM

## 2016-11-12 DIAGNOSIS — M542 Cervicalgia: Secondary | ICD-10-CM | POA: Diagnosis not present

## 2016-11-12 NOTE — Therapy (Addendum)
[place], [place], [address] Phone: [phone]   Fax:  [phone]  Physical Therapy Treatment  Patient Details  Name: Adriana Gordon MRN: [id number] Date of Birth: Feb 19, 1965 Referring Provider: Dr Dianah Field  Encounter Date: 11/12/2016      PT End of Session - 11/12/16 1105    Visit Number 6   Number of Visits 12   Date for PT Re-Evaluation 12/07/16   PT Start Time 1104   PT Stop Time 1159   PT Time Calculation (min) 55 min   Activity Tolerance Patient tolerated treatment well      Past Medical History:  Diagnosis Date  . Allergic rhinitis   . Asthma    childhood  . External hemorrhoids   . History of menorrhagia   . 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 Ovarian cyst     Past Surgical History:  Procedure Laterality Date  . BACK SURGERY    . BREAST SURGERY     reduction  . CESAREAN SECTION    . COLONOSCOPY  2017  . ENDOMETRIAL ABLATION  2006  . REDUCTION MAMMAPLASTY    . TONSILLECTOMY      There were no vitals filed for this visit.      Subjective Assessment - 11/12/16 1105    Subjective Patient reports that she is improving. She has her headache on and off. She is doing her exercise. Painting in her home and is not having trouble with that. Enjoys painting.    Currently in Pain? Yes   Pain Score 1    Pain Location Neck   Pain Orientation Left   Pain Descriptors / Indicators Tightness;Aching   Pain Type Chronic pain                         [facility] Adult PT Treatment/Exercise - 11/12/16 0001      Neck Exercises: Machines for Strengthening   UBE (Upper Arm Bike) L3 4 min alt fwd/back      Neck Exercises: Supine   Neck Retraction 10 reps;10 secs     Shoulder Exercises: Prone   Other Prone Exercises W's in extension x 10      Shoulder Exercises: Standing   Other Standing Exercises scap squeeze with noodle 10 sec x 10    Other  Standing Exercises L's x 10; W's x 10 with noodle      Shoulder Exercises: Stretch   Other Shoulder Stretches 3 way doorway stretch- 2 reps x 30 sec      Moist Heat Therapy   Number Minutes Moist Heat 20 Minutes   Moist Heat Location Cervical  thoracic     Electrical Stimulation   Electrical Stimulation Location cervical bilat - Lt scapular area    Electrical Stimulation Action IFC   Electrical Stimulation Parameters to tolerance   Electrical Stimulation Goals Pain;Tone     Manual Therapy   Manual therapy comments Pt prone   Joint Mobilization cervical and thoracic mobs in CPA plane   Soft tissue mobilization soft tissue mobilization; myofacial release posterior cervical and thoracic to Lt scapular musculature   Myofascial Release cervical to thoracic paraspinals        Dry needling bilat cervical and upper trap musculature; Lt periscapular musculature  estim with DN               PT Long Term Goals - 11/09/16 0488  PT LONG TERM GOAL #1   Title Improve posture and alignment with patient to demonstrated upright posture engaging posterior shoudler girdle musculature 12/07/16   Status On-going     PT LONG TERM GOAL #2   Title Patient reports 50-75% decrease in cervical pain and radicular symptoms 12/07/16   Status Partially Met  50% improvement     PT LONG TERM GOAL #3   Title Increase cervical ROM by 5-8 degrees in all palnes 12/07/16   Status Partially Met     PT LONG TERM GOAL #4   Title Independent in HEP 12/07/16   Status On-going     PT LONG TERM GOAL #5   Title Improve FOTO to </= 26% limitation 12/07/16   Status On-going               Plan - 11/12/16 1109    Clinical Impression Statement Continued improvement with less pain and fewer headaches. Symptoms are decreasing. Mobility is improving. Functional activity level is increasing. Progressing well toward stated goals of therapy.    Rehab Potential Good   PT Frequency 2x / week   PT  Treatment/Interventions Patient/family education;ADLs/Self Care Home Management;Cryotherapy;Electrical Stimulation;Iontophoresis 55m/ml Dexamethasone;Moist Heat;Ultrasound;Dry needling;Manual techniques;Therapeutic activities;Therapeutic exercise;Neuromuscular re-education   PT Next Visit Plan cont manual work, spinals mobs, STW and DN   Consulted and Agree with Plan of Care Patient      Patient will benefit from skilled therapeutic intervention in order to improve the following deficits and impairments:  Postural dysfunction, Improper body mechanics, Increased fascial restricitons, Increased muscle spasms, Decreased mobility, Decreased range of motion, Decreased activity tolerance  Visit Diagnosis: Abnormal posture  Other symptoms and signs involving the musculoskeletal system  Cervicalgia     Problem List Patient Active Problem List   Diagnosis Date Noted  . Borderline hypercholesterolemia 07/15/2016  . Skin lesion of chest wall 07/15/2016  . Mass of uterine cervix 07/14/2016  . Acute epigastric pain 07/13/2016    Bernese Doffing PNilda SimmerPT, MPH  11/12/2016, 1:12 PM  [facility] [address]Phone: [phone]  Fax:  [phone] Name: EHaille PardiMRN: [id number] of Birth: 105-17-1967

## 2016-11-16 ENCOUNTER — Encounter: Payer: Self-pay | Admitting: Rehabilitative and Restorative Service Providers"

## 2016-11-16 ENCOUNTER — Ambulatory Visit (INDEPENDENT_AMBULATORY_CARE_PROVIDER_SITE_OTHER): Payer: BLUE CROSS/BLUE SHIELD | Admitting: Rehabilitative and Restorative Service Providers"

## 2016-11-16 DIAGNOSIS — R293 Abnormal posture: Secondary | ICD-10-CM | POA: Diagnosis not present

## 2016-11-16 DIAGNOSIS — R29898 Other symptoms and signs involving the musculoskeletal system: Secondary | ICD-10-CM | POA: Diagnosis not present

## 2016-11-16 DIAGNOSIS — M542 Cervicalgia: Secondary | ICD-10-CM | POA: Diagnosis not present

## 2016-11-16 NOTE — Patient Instructions (Addendum)
5-10 reps each exercise 1x/day   Shoulder Blade Squeeze: Arms at Sides    Arms at sides, parallel, elbows straight, palms up. Press pelvis down. Squeeze backbone with shoulder blades, raising front of shoulders, chest, and arms. Keep head and neck neutral. Hold ___ seconds. Relax. Repeat ___ times.   Shoulder Blade Squeeze: Fingers Interlaced    Fingers interlaced behind your body, palms facing each other. Press pelvis down. Squeeze backbone with shoulder blades. Raise front of shoulders, chest, and head. Keep neck neutral. Hold ___ seconds. Relax. Repeat ___ times.   Shoulder Blade Squeeze: W    Arms out to sides at 90 palms down. Bend elbows to 90. Press pelvis down. Squeeze backbone with shoulder blades. Raise arms, front of shoulders, chest, and head. Keep neck neutral. Hold ___ seconds. Relax. Repeat ___ times.   Shoulder Blade Squeeze: Airplane    Arms out to sides at 90, elbows straight, palms down. Press pelvis down. Squeeze backbone with shoulder blades. Raise arms, front of shoulders, chest, and head. Keep neck neutral. Hold ___ seconds. Relax. Repeat ___ times.  Shoulder Blade Squeeze: Superperson    Arms alongside head, elbows straight, palms down. Press pelvis down. Squeeze backbone with shoulder blades. Raise arms, chest, and head. Keep neck neutral. Hold ___ seconds. Relax. Repeat ___ times.

## 2016-11-16 NOTE — Therapy (Addendum)
[place], [place], [address] Phone: [phone]   Fax:  [phone]  Physical Therapy Treatment  Patient Details  Name: Adriana Gordon MRN: [id number] Date of Birth: 08-18-1965 Referring Provider: Dr Dianah Field  Encounter Date: 11/16/2016      PT End of Session - 11/16/16 0853    Visit Number 7   Number of Visits 12   Date for PT Re-Evaluation 12/07/16   PT Start Time 0851   PT Stop Time 0946   PT Time Calculation (min) 55 min   Activity Tolerance Patient tolerated treatment well      Past Medical History:  Diagnosis Date  . Allergic rhinitis   . Asthma    childhood  . External hemorrhoids   . History of menorrhagia   . 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 Ovarian cyst     Past Surgical History:  Procedure Laterality Date  . BACK SURGERY    . BREAST SURGERY     reduction  . CESAREAN SECTION    . COLONOSCOPY  2017  . ENDOMETRIAL ABLATION  2006  . REDUCTION MAMMAPLASTY    . TONSILLECTOMY      There were no vitals filed for this visit.      Subjective Assessment - 11/16/16 0854    Subjective Adriana Gordon reports that she moved her dad here from [place] over the past several days. Her dad fell going into his apt yesterday and she spent the day in the ER with him.    Currently in Pain? Yes   Pain Score 1    Pain Location Neck   Pain Orientation Left   Pain Descriptors / Indicators Tightness;Aching   Pain Type Chronic pain   Pain Onset More than a month ago   Pain Frequency Constant                         [facility] Adult PT Treatment/Exercise - 11/16/16 0001      Neck Exercises: Machines for Strengthening   UBE (Upper Arm Bike) L3 4 min alt fwd/back      Neck Exercises: Prone   Axial Exentsion 10 reps   W Back 5 reps   Shoulder Extension 5 reps   Other Prone Exercise T's; superman x 5 each      Shoulder Exercises: Stretch   Other Shoulder  Stretches 3 way doorway stretch- 2 reps x 30 sec      Moist Heat Therapy   Number Minutes Moist Heat 20 Minutes   Moist Heat Location Cervical  thoracic     Electrical Stimulation   Electrical Stimulation Location cervical bilat - Lt scapular area    Electrical Stimulation Action IFC   Electrical Stimulation Parameters to tolerance   Electrical Stimulation Goals Pain;Tone     Manual Therapy   Manual therapy comments Pt prone   Soft tissue mobilization soft tissue mobilization; myofacial release posterior cervical and thoracic to Lt scapular musculature   Myofascial Release cervical to thoracic paraspinals; Lt periscapular musculature      Neck Exercises: Stretches   Other Neck Stretches axial extension supine 10 sec x 5 on pillow       DN bilat cervical and upper trap musculature  estim with DN           PT Education - 11/16/16 0905    Education provided Yes   Education Details HEP    Person(s) Educated Patient  Methods Explanation;Tactile cues;Demonstration;Verbal cues;Handout   Comprehension Verbalized understanding;Returned demonstration;Verbal cues required;Tactile cues required             PT Long Term Goals - 11/16/16 0854      PT LONG TERM GOAL #1   Title Improve posture and alignment with patient to demonstrated upright posture engaging posterior shoudler girdle musculature 12/07/16   Time 6   Period Weeks   Status On-going     PT LONG TERM GOAL #2   Title Patient reports 50-75% decrease in cervical pain and radicular symptoms 12/07/16   Time 6   Period Weeks   Status Partially Met     PT LONG TERM GOAL #3   Title Increase cervical ROM by 5-8 degrees in all palnes 12/07/16   Time 6   Period Weeks   Status Partially Met     PT LONG TERM GOAL #4   Title Independent in HEP 12/07/16   Time 6   Period Weeks   Status On-going     PT LONG TERM GOAL #5   Title Improve FOTO to </= 26% limitation 12/07/16   Time 6   Period Weeks   Status  On-going             Patient will benefit from skilled therapeutic intervention in order to improve the following deficits and impairments:     Visit Diagnosis: Abnormal posture  Other symptoms and signs involving the musculoskeletal system  Cervicalgia     Problem List Patient Active Problem List   Diagnosis Date Noted  . Borderline hypercholesterolemia 07/15/2016  . Skin lesion of chest wall 07/15/2016  . Mass of uterine cervix 07/14/2016  . Acute epigastric pain 07/13/2016    Kenshin Splawn Nilda Simmer PT, MPH  11/16/2016, 9:39 AM  [facility] [place], [place], [address] Phone: [phone]   Fax:  [phone]  Name: Adriana Gordon MRN: [id number] Date of Birth: February 18, 1965

## 2016-11-17 ENCOUNTER — Encounter: Payer: BLUE CROSS/BLUE SHIELD | Admitting: Rehabilitative and Restorative Service Providers"

## 2016-11-19 ENCOUNTER — Encounter: Payer: Self-pay | Admitting: Rehabilitative and Restorative Service Providers"

## 2016-11-19 ENCOUNTER — Ambulatory Visit (INDEPENDENT_AMBULATORY_CARE_PROVIDER_SITE_OTHER): Payer: BLUE CROSS/BLUE SHIELD

## 2016-11-19 ENCOUNTER — Other Ambulatory Visit: Payer: Self-pay | Admitting: Sports Medicine

## 2016-11-19 ENCOUNTER — Ambulatory Visit (INDEPENDENT_AMBULATORY_CARE_PROVIDER_SITE_OTHER): Payer: BLUE CROSS/BLUE SHIELD | Admitting: Sports Medicine

## 2016-11-19 ENCOUNTER — Ambulatory Visit (INDEPENDENT_AMBULATORY_CARE_PROVIDER_SITE_OTHER): Payer: BLUE CROSS/BLUE SHIELD | Admitting: Rehabilitative and Restorative Service Providers"

## 2016-11-19 DIAGNOSIS — R300 Dysuria: Secondary | ICD-10-CM | POA: Diagnosis not present

## 2016-11-19 DIAGNOSIS — M1812 Unilateral primary osteoarthritis of first carpometacarpal joint, left hand: Secondary | ICD-10-CM | POA: Diagnosis not present

## 2016-11-19 DIAGNOSIS — M542 Cervicalgia: Secondary | ICD-10-CM | POA: Diagnosis not present

## 2016-11-19 DIAGNOSIS — J3089 Other allergic rhinitis: Secondary | ICD-10-CM | POA: Diagnosis not present

## 2016-11-19 DIAGNOSIS — R293 Abnormal posture: Secondary | ICD-10-CM

## 2016-11-19 DIAGNOSIS — M180 Bilateral primary osteoarthritis of first carpometacarpal joints: Secondary | ICD-10-CM | POA: Insufficient documentation

## 2016-11-19 DIAGNOSIS — R29898 Other symptoms and signs involving the musculoskeletal system: Secondary | ICD-10-CM

## 2016-11-19 LAB — POCT URINALYSIS DIPSTICK
Bilirubin, UA: NEGATIVE
Glucose, UA: NEGATIVE
Ketones, UA: NEGATIVE
Nitrite, UA: NEGATIVE
Protein, UA: NEGATIVE
Spec Grav, UA: 1.02 (ref 1.010–1.025)
Urobilinogen, UA: 0.2 E.U./dL
pH, UA: 7 (ref 5.0–8.0)

## 2016-11-19 MED ORDER — FLUTICASONE PROPIONATE 50 MCG/ACT NA SUSP: NASAL | 3 refills | Status: DC

## 2016-11-19 MED ORDER — BUPROPION HCL ER (XL) 300 MG PO TB24: 300.0000 mg | ORAL_TABLET | Freq: Every day | ORAL | 3 refills | Status: DC

## 2016-11-19 MED ORDER — CEPHALEXIN 500 MG PO CAPS: 500.0000 mg | ORAL_CAPSULE | Freq: Two times a day (BID) | ORAL | 0 refills | Status: DC

## 2016-11-19 MED ORDER — CYCLOBENZAPRINE HCL 10 MG PO TABS: ORAL_TABLET | ORAL | 0 refills | Status: DC

## 2016-11-19 NOTE — Assessment & Plan Note (Signed)
Nasal fluticasone twice daily. Exam is benign with the exception of some boggy and erythematous turbinates, mucous. Septum is fairly straight.

## 2016-11-19 NOTE — Addendum Note (Signed)
Addended by: Baird KayUGLAS, Carmelia Tiner M on: 11/19/2016 10:28 AM   Modules accepted: Orders

## 2016-11-19 NOTE — Therapy (Addendum)
[place], [place], [address] Phone: [phone]   Fax:  [phone]  Physical Therapy Treatment  Patient Details  Name: Brezlyn Manrique MRN: [id number] Date of Birth: 07-08-1965 Referring Provider: Dr Dianah Field  Encounter Date: 11/19/2016      PT End of Session - 11/19/16 1103    Visit Number 8   Number of Visits 12   Date for PT Re-Evaluation 12/07/16   PT Start Time 1100   PT Stop Time 3254   PT Time Calculation (min) 56 min   Activity Tolerance Patient tolerated treatment well      Past Medical History:  Diagnosis Date  . Allergic rhinitis   . Asthma    childhood  . External hemorrhoids   . History of menorrhagia   . 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 Ovarian cyst     Past Surgical History:  Procedure Laterality Date  . BACK SURGERY    . BREAST SURGERY     reduction  . CESAREAN SECTION    . COLONOSCOPY  2017  . ENDOMETRIAL ABLATION  2006  . REDUCTION MAMMAPLASTY    . TONSILLECTOMY      There were no vitals filed for this visit.      Subjective Assessment - 11/19/16 1103    Subjective Sarahelizabeth reports that she is feeling better today. Saw Dr T this am and received injection in the Lt thumb. Just relaxed and stretched yesterday.    Currently in Pain? Yes   Pain Score 1    Pain Location Neck   Pain Orientation Left   Pain Descriptors / Indicators Tightness;Aching   Pain Type Chronic pain                         [facility] Adult PT Treatment/Exercise - 11/19/16 0001      Neck Exercises: Supine   Neck Retraction 10 reps;10 secs     Neck Exercises: Prone   Axial Exentsion 10 reps   W Back 5 reps   Shoulder Extension 5 reps   Other Prone Exercise T's; superman x 5 each      Shoulder Exercises: Stretch   Other Shoulder Stretches 3 way doorway stretch- 2 reps x 30 sec      Moist Heat Therapy   Number Minutes Moist Heat 20 Minutes   Moist Heat Location Cervical  thoracic     Electrical Stimulation   Electrical Stimulation Location cervical bilat - Lt scapular area    Electrical Stimulation Action IFC   Electrical Stimulation Parameters to tolerance   Electrical Stimulation Goals Pain;Tone     Manual Therapy   Manual Therapy Soft tissue mobilization;Joint mobilization   Manual therapy comments Pt prone   Joint Mobilization cervical and thoracic mobs in CPA plane   Soft tissue mobilization soft tissue mobilization; myofacial release posterior cervical and thoracic to Lt scapular musculature   Myofascial Release cervical to thoracic paraspinals; Lt periscapular musculature           Trigger Point Dry Needling - 11/19/16 1144    Consent Given? Yes   Muscles Treated Upper Body --  estim with DN    Upper Trapezius Response Palpable increased muscle length  bilat    SubOccipitals Response Palpable increased muscle length  bilat    Levator Scapulae Response Palpable increased muscle length  Lt    Rhomboids Response Palpable increased muscle length  Lt  Subscapularis Response Palpable increased muscle length  Lt    Longissimus Response Palpable increased muscle length  cervical and thoracic bilat at TP's                    PT Long Term Goals - 11/16/16 0854      PT LONG TERM GOAL #1   Title Improve posture and alignment with patient to demonstrated upright posture engaging posterior shoudler girdle musculature 12/07/16   Time 6   Period Weeks   Status On-going     PT LONG TERM GOAL #2   Title Patient reports 50-75% decrease in cervical pain and radicular symptoms 12/07/16   Time 6   Period Weeks   Status Partially Met     PT LONG TERM GOAL #3   Title Increase cervical ROM by 5-8 degrees in all palnes 12/07/16   Time 6   Period Weeks   Status Partially Met     PT LONG TERM GOAL #4   Title Independent in HEP 12/07/16   Time 6   Period Weeks   Status On-going     PT LONG TERM  GOAL #5   Title Improve FOTO to </= 26% limitation 12/07/16   Time 6   Period Weeks   Status On-going               Plan - 11/19/16 1108    Clinical Impression Statement Less pain and tightness. Responds well to DN and manual work.    Rehab Potential Good   PT Frequency 2x / week   PT Duration 6 weeks   PT Treatment/Interventions Patient/family education;ADLs/Self Care Home Management;Cryotherapy;Electrical Stimulation;Iontophoresis 29m/ml Dexamethasone;Moist Heat;Ultrasound;Dry needling;Manual techniques;Therapeutic activities;Therapeutic exercise;Neuromuscular re-education   PT Next Visit Plan cont manual work, spinals mobs, STW and DN   Consulted and Agree with Plan of Care Patient      Patient will benefit from skilled therapeutic intervention in order to improve the following deficits and impairments:  Cardiopulmonary status limiting activity  Visit Diagnosis: Abnormal posture  Other symptoms and signs involving the musculoskeletal system  Cervicalgia     Problem List Patient Active Problem List   Diagnosis Date Noted  . Primary osteoarthritis of first carpometacarpal joint of left hand 11/19/2016  . Dysuria 11/19/2016  . Perennial allergic rhinitis 11/19/2016  . Borderline hypercholesterolemia 07/15/2016  . Skin lesion of chest wall 07/15/2016  . Mass of uterine cervix 07/14/2016  . Acute epigastric pain 07/13/2016    Nichole Keltner PNilda SimmerPT, MPH  11/19/2016, 11:47 AM  [facility] [address] [address]Phone: [phone]  Fax:  [phone] Name: EVincie LinnMRN: [id number] of Birth: 1August 20, 1967 PHYSICAL THERAPY DISCHARGE SUMMARY  Visits from Start of Care: 8  Current functional level related to goals / functional outcomes: See progress note for discharge status   Remaining deficits: Unknown     Education / Equipment: HEP   Plan: Patient agrees to discharge.   Patient goals were partially met. Patient is being discharged due to not returning since the last visit.  ?????    Chinonso Linker P. HHelene KelpPT, MPH 12/15/16 11:09 AM

## 2016-11-19 NOTE — Assessment & Plan Note (Signed)
Adding Keflex. Urine culture.

## 2016-11-19 NOTE — Progress Notes (Signed)
   Subjective:    I'm seeing this patient as a consultation for: Adriana Frayharley Cummings, PA-C  CC: Left hand pain  HPI: For the past several years this pleasant 51 year old female has had pain that she localizes the base of her left thumb, moderate, persistent without radiation, gelling, she has been taking oral NSAIDs, and using topical diclofenac without significant relief.  Dysuria: Present for the past couple days, no flank pain, constitutional symptoms.  Stuffy nose: Worse at night, only using Flonase intermittently.  Past medical history, Surgical history, Family history not pertinant except as noted below, Social history, Allergies, and medications have been entered into the medical record, reviewed, and no changes needed.   Review of Systems: No headache, visual changes, nausea, vomiting, diarrhea, constipation, dizziness, abdominal pain, skin rash, fevers, chills, night sweats, weight loss, swollen lymph nodes, body aches, joint swelling, muscle aches, chest pain, shortness of breath, mood changes, visual or auditory hallucinations.   Objective:   General: Well Developed, well nourished, and in no acute distress.  Neuro:  Extra-ocular muscles intact, able to move all 4 extremities, sensation grossly intact.  Deep tendon reflexes tested were normal. Psych: Alert and oriented, mood congruent with affect. ENT:  Ears and nose appear unremarkable.  Hearing grossly normal.  Oropharynx, ear canals unremarkable, nasopharynx is boggy and erythematous turbinates with significant mucus production. Neck: Unremarkable overall appearance, trachea midline.  No visible thyroid enlargement. Eyes: Conjunctivae and lids appear unremarkable.  Pupils equal and round. Skin: Warm and dry, no rashes noted.  Cardiovascular: Pulses palpable, no extremity edema. Abdomen: Soft, nontender, nondistended, normal bowel sounds, no palpable masses, no guarding, rigidity, rebound tenderness. Left hand: Tender to  palpation at the thumb basal joint, reproduction of pain with abduction and extension of the first digit, negative Finkelstein sign.  Procedure: Real-time Ultrasound Guided Injection of left trapeziometacarpal joint Device: GE Logiq E  Verbal informed consent obtained.  Time-out conducted.  Noted no overlying erythema, induration, or other signs of local infection.  Skin prepped in a sterile fashion.  Local anesthesia: Topical Ethyl chloride.  With sterile technique and under real time ultrasound guidance: Using a 25-gauge needle advanced into the trapeziometacarpal joint and injected 1/2 cc Kenalog 40, 1/2 cc lidocaine. Completed without difficulty  Pain immediately resolved suggesting accurate placement of the medication.  Advised to call if fevers/chills, erythema, induration, drainage, or persistent bleeding.  Images permanently stored and available for review in the ultrasound unit.  Impression: Technically successful ultrasound guided injection.  Impression and Recommendations:   This case required medical decision making of moderate complexity.  Perennial allergic rhinitis Nasal fluticasone twice daily. Exam is benign with the exception of some boggy and erythematous turbinates, mucous. Septum is fairly straight.  Primary osteoarthritis of first carpometacarpal joint of left hand Has failed topical NSAIDs as well as orals. Injection as above. X-rays. Return in 1 month for this.  Dysuria Adding Keflex. Urine culture.  ___________________________________________ Ihor Austinhomas J. Benjamin Stainhekkekandam, M.D., ABFM., CAQSM. Primary Care and Sports Medicine [facility] [place]  Adjunct Instructor of Family Medicine  [employer]

## 2016-11-19 NOTE — Assessment & Plan Note (Signed)
Has failed topical NSAIDs as well as orals. Injection as above. X-rays. Return in 1 month for this.

## 2016-11-22 LAB — URINE CULTURE
MICRO NUMBER:: 81233261
SPECIMEN QUALITY:: ADEQUATE

## 2016-11-25 ENCOUNTER — Encounter: Payer: BLUE CROSS/BLUE SHIELD | Admitting: Internal Medicine

## 2016-12-13 ENCOUNTER — Encounter: Payer: Self-pay | Admitting: Internal Medicine

## 2016-12-13 ENCOUNTER — Ambulatory Visit (AMBULATORY_SURGERY_CENTER): Payer: BLUE CROSS/BLUE SHIELD | Admitting: Internal Medicine

## 2016-12-13 ENCOUNTER — Other Ambulatory Visit: Payer: Self-pay

## 2016-12-13 VITALS — BP 120/76 | HR 83 | Temp 97.8°F | Resp 14 | Ht 60.0 in | Wt 121.0 lb

## 2016-12-13 DIAGNOSIS — R1013 Epigastric pain: Secondary | ICD-10-CM | POA: Diagnosis not present

## 2016-12-13 MED ORDER — SODIUM CHLORIDE 0.9 % IV SOLN: 500.0000 mL | INTRAVENOUS | Status: DC

## 2016-12-13 NOTE — Progress Notes (Signed)
Report to PACU, RN, vss, BBS= Clear.  

## 2016-12-13 NOTE — Progress Notes (Signed)
Pt. Reports no change in her medical or surgical history since her pre-visit 09/30/2016.

## 2016-12-13 NOTE — Op Note (Signed)
[facility] Patient Name: Adriana Gordon Procedure Date: 12/13/2016 9:49 AM MRN: [id number] Endoscopist: Wilhemina BonitoJohn N. Marina GoodellPerry , MD Age: [age] Referring MD:  Date of Birth: March 26, 1965 Gender: Female Account #: [id number] Procedure:                Upper GI endoscopy Indications:              Epigastric abdominal pain. Negative extensive                            workup including laboratories, CT scan, ultrasound,                            and gastric emptying scan. No response to PPIs or                            antispasmodics Medicines:                Monitored Anesthesia Care Procedure:                Pre-Anesthesia Assessment:                           - Prior to the procedure, a History and Physical                            was performed, and patient medications and                            allergies were reviewed. The patient's tolerance of                            previous anesthesia was also reviewed. The risks                            and benefits of the procedure and the sedation                            options and risks were discussed with the patient.                            All questions were answered, and informed consent                            was obtained. Prior Anticoagulants: The patient has                            taken no previous anticoagulant or antiplatelet                            agents. ASA Grade Assessment: I - A normal, healthy                            patient. After reviewing the risks and benefits,  the patient was deemed in satisfactory condition to                            undergo the procedure.                           After obtaining informed consent, the endoscope was                            passed under direct vision. Throughout the                            procedure, the patient's blood pressure, pulse, and                            oxygen saturations were monitored continuously. The                         Model GIF-HQ190 [id number] scope was introduced                            through the mouth, and advanced to the second part                            of duodenum. The upper GI endoscopy was                            accomplished without difficulty. The patient                            tolerated the procedure well. Scope In: Scope Out: Findings:                 The esophagus was normal.                           The stomach was normal.                           The examined duodenum was normal.                           The cardia and gastric fundus were normal on                            retroflexion. Complications:            No immediate complications. Estimated Blood Loss:     Estimated blood loss: none. Impression:               - Normal EGD                           - No GI cause for chronic pain identified or                            suspected. Possibly musculoskeletal. Recommendation:           -  Patient has a contact number available for                            emergencies. The signs and symptoms of potential                            delayed complications were discussed with the                            patient. Return to normal activities tomorrow.                            Written discharge instructions were provided to the                            patient.                           - Resume previous diet.                           - Continue present medications.                           - Return to the care of your primary provider Wilhemina BonitoJohn N. Marina GoodellPerry, MD 12/13/2016 10:06:53 AM This report has been signed electronically.

## 2016-12-13 NOTE — Patient Instructions (Signed)
YOU HAD AN ENDOSCOPIC PROCEDURE TODAY AT THE [facility]:   Refer to the procedure report that was given to you for any specific questions about what was found during the examination.  If the procedure report does not answer your questions, please call your gastroenterologist to clarify.  If you requested that your care partner not be given the details of your procedure findings, then the procedure report has been included in a sealed envelope for you to review at your convenience later.  YOU SHOULD EXPECT: Some feelings of bloating in the abdomen. Passage of more gas than usual.  Walking can help get rid of the air that was put into your GI tract during the procedure and reduce the bloating. If you had a lower endoscopy (such as a colonoscopy or flexible sigmoidoscopy) you may notice spotting of blood in your stool or on the toilet paper. If you underwent a bowel prep for your procedure, you may not have a normal bowel movement for a few days.  Please Note:  You might notice some irritation and congestion in your nose or some drainage.  This is from the oxygen used during your procedure.  There is no need for concern and it should clear up in a day or so.  SYMPTOMS TO REPORT IMMEDIATELY:   Following upper endoscopy (EGD)  Vomiting of blood or coffee ground material  New chest pain or pain under the shoulder blades  Painful or persistently difficult swallowing  New shortness of breath  Fever of 100F or higher  Black, tarry-looking stools  For urgent or emergent issues, a gastroenterologist can be reached at any hour by calling [phone].   DIET:  We do recommend a small meal at first, but then you may proceed to your regular diet.  Drink plenty of fluids but you should avoid alcoholic beverages for 24 hours.  ACTIVITY:  You should plan to take it easy for the rest of today and you should NOT DRIVE or use heavy machinery until tomorrow (because of the sedation medicines used  during the test).    FOLLOW UP: Our staff will call the number listed on your records the next business day following your procedure to check on you and address any questions or concerns that you may have regarding the information given to you following your procedure. If we do not reach you, we will leave a message.  However, if you are feeling well and you are not experiencing any problems, there is no need to return our call.  We will assume that you have returned to your regular daily activities without incident.  If any biopsies were taken you will be contacted by phone or by letter within the next 1-3 weeks.  Please call us at [phone] if you have not heard about the biopsies in 3 weeks.    SIGNATURES/CONFIDENTIALITY: You and/or your care partner have signed paperwork which will be entered into your electronic medical record.  These signatures attest to the fact that that the information above on your After Visit Summary has been reviewed and is understood.  Full responsibility of the confidentiality of this discharge information lies with you and/or your care-partner. 

## 2016-12-14 ENCOUNTER — Telehealth: Payer: Self-pay

## 2016-12-14 NOTE — Telephone Encounter (Signed)
  Follow up Call-  Call back number 12/13/2016  Post procedure Call Back phone  # [phone]  Permission to leave phone message Yes  Some recent data might be hidden     Patient questions:  Do you have a fever, pain , or abdominal swelling? No. Pain Score  0 *  Have you tolerated food without any problems? Yes.    Have you been able to return to your normal activities? Yes.    Do you have any questions about your discharge instructions: Diet   No. Medications  No. Follow up visit  No.  Do you have questions or concerns about your Care? No.  Actions: * If pain score is 4 or above: No action needed, pain <4.

## 2016-12-17 ENCOUNTER — Ambulatory Visit (INDEPENDENT_AMBULATORY_CARE_PROVIDER_SITE_OTHER): Payer: BLUE CROSS/BLUE SHIELD | Admitting: Sports Medicine

## 2016-12-17 ENCOUNTER — Encounter: Payer: Self-pay | Admitting: Sports Medicine

## 2016-12-17 ENCOUNTER — Ambulatory Visit (INDEPENDENT_AMBULATORY_CARE_PROVIDER_SITE_OTHER): Payer: BLUE CROSS/BLUE SHIELD

## 2016-12-17 DIAGNOSIS — M47892 Other spondylosis, cervical region: Secondary | ICD-10-CM

## 2016-12-17 DIAGNOSIS — M5030 Other cervical disc degeneration, unspecified cervical region: Secondary | ICD-10-CM | POA: Diagnosis not present

## 2016-12-17 DIAGNOSIS — M1812 Unilateral primary osteoarthritis of first carpometacarpal joint, left hand: Secondary | ICD-10-CM

## 2016-12-17 DIAGNOSIS — M542 Cervicalgia: Secondary | ICD-10-CM

## 2016-12-17 NOTE — Progress Notes (Signed)
   Subjective:    I'm seeing this patient as a consultation for: Adriana Frayharley Cummings, PA-C  CC: Neck pain  HPI: This is a pleasant 51 year old female, she is post multilevel ACDF, she had left periscapular radicular symptoms beforehand, these went away after the surgery, but more recently have recurred.  She has had epidurals in the recent past that have been effective.  Having moderate pain around her periscapular region, no radiation down the arm, no trauma, no constitutional symptoms.  Left first carpometacarpal arthritis: Pain resolved after injection at the last visit.  Past medical history, Surgical history, Family history not pertinant except as noted below, Social history, Allergies, and medications have been entered into the medical record, reviewed, and no changes needed.   Review of Systems: No headache, visual changes, nausea, vomiting, diarrhea, constipation, dizziness, abdominal pain, skin rash, fevers, chills, night sweats, weight loss, swollen lymph nodes, body aches, joint swelling, muscle aches, chest pain, shortness of breath, mood changes, visual or auditory hallucinations.   Objective:   General: Well Developed, well nourished, and in no acute distress.  Neuro:  Extra-ocular muscles intact, able to move all 4 extremities, sensation grossly intact.  Deep tendon reflexes tested were normal. Psych: Alert and oriented, mood congruent with affect. ENT:  Ears and nose appear unremarkable.  Hearing grossly normal. Neck: Unremarkable overall appearance, trachea midline.  No visible thyroid enlargement. Eyes: Conjunctivae and lids appear unremarkable.  Pupils equal and round. Skin: Warm and dry, no rashes noted.  Cardiovascular: Pulses palpable, no extremity edema. Neck: Negative spurling's Full neck range of motion Grip strength and sensation normal in bilateral hands Strength good C4 to T1 distribution No sensory change to C4 to T1 Reflexes normal Multiple painful trigger  points along the left trapezius and rhomboids  Procedure: #4 trigger point injections along the trapezius and the rhomboids Consent obtained and verified. Time-out conducted. Noted no overlying erythema, induration, or other signs of local infection. Skin prepped in a sterile fashion. Topical analgesic spray: Ethyl chloride. Completed without difficulty. Meds: In a fanlike pattern I injected a total of 1 cc kenalog 40, 2 cc lidocaine, 2 cc bupivacaine along the 4 trigger points Pain immediately improved suggesting accurate placement of the medication. Advised to call if fevers/chills, erythema, induration, drainage, or persistent bleeding.  X-rays show a C5-C7 ACDF without complications, there is C7-T1 degenerative changes.  Impression and Recommendations:   This case required medical decision making of moderate complexity.  DDD (degenerative disc disease), cervical Status post multilevel cervical fusion. She had good resolution of the pain but then recurrence of left periscapular radicular symptoms. X-rays, MRI with and without contrast, she has already done physical therapy, steroids. Return to see me for MRI results and for interventional/epidural planning, she has had epidurals in the past that provided good relief. I am going to give her some trigger point injections today for symptomatic relief.  Primary osteoarthritis of first carpometacarpal joint of left hand Pain resolved after injection last month.  ___________________________________________ Ihor Austinhomas J. Benjamin Stainhekkekandam, M.D., ABFM., CAQSM. Primary Care and Sports Medicine [facility]  Adjunct Instructor of Family Medicine  [employer]

## 2016-12-17 NOTE — Assessment & Plan Note (Signed)
Pain resolved after injection last month.

## 2016-12-17 NOTE — Assessment & Plan Note (Addendum)
Status post multilevel cervical fusion. She had good resolution of the pain but then recurrence of left periscapular radicular symptoms. X-rays, MRI with and without contrast, she has already done physical therapy, steroids. Return to see me for MRI results and for interventional/epidural planning, she has had epidurals in the past that provided good relief. I am going to give her some trigger point injections today for symptomatic relief.

## 2016-12-21 ENCOUNTER — Ambulatory Visit (INDEPENDENT_AMBULATORY_CARE_PROVIDER_SITE_OTHER): Payer: BLUE CROSS/BLUE SHIELD | Admitting: Internal Medicine

## 2016-12-21 VITALS — BP 134/72 | HR 66 | Ht 60.0 in | Wt 120.6 lb

## 2016-12-21 DIAGNOSIS — K648 Other hemorrhoids: Secondary | ICD-10-CM | POA: Diagnosis not present

## 2016-12-21 NOTE — Patient Instructions (Addendum)
You have been scheduled for your 3rd hemorrhoidal banding on 01/04/17 at 3:30 pm.  HEMORRHOID BANDING PROCEDURE    FOLLOW-UP CARE   1. The procedure you have had should have been relatively painless since the banding of the area involved does not have nerve endings and there is no pain sensation.  The rubber band cuts off the blood supply to the hemorrhoid and the band may fall off as soon as 48 hours after the banding (the band may occasionally be seen in the toilet bowl following a bowel movement). You may notice a temporary feeling of fullness in the rectum which should respond adequately to plain Tylenol or Motrin.  2. Following the banding, avoid strenuous exercise that evening and resume full activity the next day.  A sitz bath (soaking in a warm tub) or bidet is soothing, and can be useful for cleansing the area after bowel movements.     3. To avoid constipation, take two tablespoons of natural wheat bran, natural oat bran, flax, Benefiber or any over the counter fiber supplement and increase your water intake to 7-8 glasses daily.    4. Unless you have been prescribed anorectal medication, do not put anything inside your rectum for two weeks: No suppositories, enemas, fingers, etc.  5. Occasionally, you may have more bleeding than usual after the banding procedure.  This is often from the untreated hemorrhoids rather than the treated one.  Don't be concerned if there is a tablespoon or so of blood.  If there is more blood than this, lie flat with your bottom higher than your head and apply an ice pack to the area. If the bleeding does not stop within a half an hour or if you feel faint, call our office at [phone] or go to the emergency room.  6. Problems are not common; however, if there is a substantial amount of bleeding, severe pain, chills, fever or difficulty passing urine (very rare) or other problems, you should call us at [phone] or report to the nearest emergency  room.  7. Do not stay seated continuously for more than 2-3 hours for a day or two after the procedure.  Tighten your buttock muscles 10-15 times every two hours and take 10-15 deep breaths every 1-2 hours.  Do not spend more than a few minutes on the toilet if you cannot empty your bowel; instead re-visit the toilet at a later time.

## 2016-12-21 NOTE — Progress Notes (Signed)
Adriana Gordon returns after initial hemorrhoidal banding for symptom medic internal hemorrhoids Symptoms prior to initial banding were prolapse, fecal smearing and perianal irritation with very rare bleeding She has benefited after 1 banding to the left lateral internal hemorrhoid.  She still having some intermittent prolapse symptoms and perianal irritation but overall improved even with 1 banding   PROCEDURE NOTE:  The patient presents with symptomatic grade 3 internal hemorrhoids, requesting rubber band ligation of her hemorrhoidal disease.  All risks, benefits and alternative forms of therapy were described and informed consent was obtained.   The anorectum was pre-medicated with 0.125% nitroglycerin ointment The decision was made to band the right anterior (left lateral band on 10/18/2016) internal hemorrhoid, and the [facility] System was used to perform band ligation without complication.   Digital anorectal examination was then performed to assure proper positioning of the band, and to adjust the banded tissue as required.  The patient was discharged home without pain or other issues.  Dietary and behavioral recommendations were given and along with follow-up instructions.     The patient will return as scheduled for  follow-up and possible additional banding as required. No complications were encountered and the patient tolerated the procedure well.

## 2016-12-22 ENCOUNTER — Telehealth: Payer: Self-pay | Admitting: Sports Medicine

## 2016-12-27 ENCOUNTER — Other Ambulatory Visit: Payer: BLUE CROSS/BLUE SHIELD

## 2016-12-30 ENCOUNTER — Ambulatory Visit: Payer: BLUE CROSS/BLUE SHIELD | Admitting: Sports Medicine

## 2017-01-03 ENCOUNTER — Ambulatory Visit (INDEPENDENT_AMBULATORY_CARE_PROVIDER_SITE_OTHER): Payer: BLUE CROSS/BLUE SHIELD | Admitting: Sports Medicine

## 2017-01-03 ENCOUNTER — Encounter: Payer: Self-pay | Admitting: Sports Medicine

## 2017-01-03 DIAGNOSIS — M5030 Other cervical disc degeneration, unspecified cervical region: Secondary | ICD-10-CM | POA: Diagnosis not present

## 2017-01-03 DIAGNOSIS — Z299 Encounter for prophylactic measures, unspecified: Secondary | ICD-10-CM | POA: Diagnosis not present

## 2017-01-03 NOTE — Progress Notes (Signed)
  Subjective:    CC: Neck pain  HPI: This is a very pleasant 51 year old female with a history of a C5-C7 ACDF, she did well initially but then had an increase in pain, left periscapular, nothing really down to the hands or fingertips.  Moderate, persistent.  We obtained an MRI after she failed physical therapy, steroids, NSAIDs, gabapentin.  Past medical history:  Negative.  See flowsheet/record as well for more information.  Surgical history: Negative.  See flowsheet/record as well for more information.  Family history: Negative.  See flowsheet/record as well for more information.  Social history: Negative.  See flowsheet/record as well for more information.  Allergies, and medications have been entered into the medical record, reviewed, and no changes needed.   (To billers/coders, pertinent past medical, social, surgical, family history can be found in problem list, if problem list is marked as reviewed then this indicates that past medical, social, surgical, family history was also reviewed)  Review of Systems: No fevers, chills, night sweats, weight loss, chest pain, or shortness of breath.   Objective:    General: Well Developed, well nourished, and in no acute distress.  Neuro: Alert and oriented x3, extra-ocular muscles intact, sensation grossly intact.  HEENT: Normocephalic, atraumatic, pupils equal round reactive to light, neck supple, no masses, no lymphadenopathy, thyroid nonpalpable.  Skin: Warm and dry, no rashes. Cardiac: Regular rate and rhythm, no murmurs rubs or gallops, no lower extremity edema.  Respiratory: Clear to auscultation bilaterally. Not using accessory muscles, speaking in full sentences.  MRI shows mild multilevel degenerative disc disease, stable C5-C7 ACDF and a left-sided C5-C6 foraminal stenosis, multifactorial.  Impression and Recommendations:    DDD (degenerative disc disease), cervical Cervical spine MRI shows some expected adjacent degenerative  changes as well as the uncomplicated C5-C7 ACDF. There does appear to be some left-sided foraminal stenosis at C5-C6. We are going to proceed with a left-sided cervical epidural. Return to see me 4 weeks after the epidural to evaluate response, I do anticipate starting again with low-dose gabapentin for symptom relief, at least at bedtime start out with.  Preventive measure DEXA ordered.  ___________________________________________ Ihor Austinhomas J. Benjamin Stainhekkekandam, M.D., ABFM., CAQSM. Primary Care and Sports Medicine [facility]  Adjunct Instructor of Family Medicine  [employer]

## 2017-01-03 NOTE — Assessment & Plan Note (Signed)
Cervical spine MRI shows some expected adjacent degenerative changes as well as the uncomplicated C5-C7 ACDF. There does appear to be some left-sided foraminal stenosis at C5-C6. We are going to proceed with a left-sided cervical epidural. Return to see me 4 weeks after the epidural to evaluate response, I do anticipate starting again with low-dose gabapentin for symptom relief, at least at bedtime start out with.

## 2017-01-03 NOTE — Assessment & Plan Note (Signed)
DEXA ordered.  

## 2017-01-04 ENCOUNTER — Ambulatory Visit (INDEPENDENT_AMBULATORY_CARE_PROVIDER_SITE_OTHER): Payer: BLUE CROSS/BLUE SHIELD | Admitting: Internal Medicine

## 2017-01-04 ENCOUNTER — Encounter: Payer: Self-pay | Admitting: Internal Medicine

## 2017-01-04 VITALS — BP 118/76 | HR 74 | Ht 60.0 in | Wt 118.0 lb

## 2017-01-04 DIAGNOSIS — K648 Other hemorrhoids: Secondary | ICD-10-CM | POA: Diagnosis not present

## 2017-01-04 NOTE — Patient Instructions (Signed)

## 2017-01-04 NOTE — Progress Notes (Signed)
Adriana Gordon is a 51 year old female with symptomatic internal hemorrhoids. She had hemorrhoidal banding on 10/18/16 and 12/21/16 She reports improvement overall in her hemorrhoidal symptoms which included prolapse, fecal smearing, perianal irritation and rare rectal bleeding. She still feels prolapse in the left lateral to the anterior position which is irritating.  ANOSCOPY: Using a disposable, lubricated, slotted, self-illuminating anoscope, the rectum was intubated without difficulty. The trochar was removed and the ano-rectum was circumferentially inspected. There were internal hemorrhoids, small in RP, still present in the LL at side of most visible prolapse with reduces, and none in the RA. There was no finding of an anorectal fissure. The rectal mucosa did not appear inflamed. The inspection was well tolerated.    PROCEDURE NOTE:  The patient presents with symptomatic grade 2-3 internal hemorrhoids, requesting rubber band ligation of  her hemorrhoidal disease.  All risks, benefits and alternative forms of therapy were described and informed consent was obtained.  We also discussed that placing 2 bands yields a slightly higher risk of pain and bleeding.  After this discussion she wishes to proceed.   The anorectum was pre-medicated with 0.125% nitroglycerin ointment The decision was made to band the right posterior internal hemorrhoid, and to repeat banding in the left lateral position.  The [facility] System was used to perform band ligation without complication.   Digital anorectal examination was then performed to assure proper positioning of the band, and to adjust the banded tissue as required.  The patient was discharged home without pain or other issues.  Dietary and behavioral recommendations were given and along with follow-up instructions.     The patient will return as needed should there be residual hemorrhoidal symptoms. No complications were encountered and the patient  tolerated the procedure well.

## 2017-01-07 ENCOUNTER — Encounter: Payer: BLUE CROSS/BLUE SHIELD | Admitting: Internal Medicine

## 2017-01-12 ENCOUNTER — Ambulatory Visit (INDEPENDENT_AMBULATORY_CARE_PROVIDER_SITE_OTHER): Payer: BLUE CROSS/BLUE SHIELD

## 2017-01-12 DIAGNOSIS — Z13820 Encounter for screening for osteoporosis: Secondary | ICD-10-CM | POA: Diagnosis not present

## 2017-01-13 ENCOUNTER — Ambulatory Visit
Admission: RE | Admit: 2017-01-13 | Discharge: 2017-01-13 | Disposition: A | Discharge status: Home / Self Care | Payer: BLUE CROSS/BLUE SHIELD | Source: Ambulatory Visit | Attending: Sports Medicine | Admitting: Sports Medicine

## 2017-01-13 MED ORDER — TRIAMCINOLONE ACETONIDE 40 MG/ML IJ SUSP (RADIOLOGY)
60.0000 mg | Freq: Once | INTRAMUSCULAR | Status: AC
  Administered 2017-01-13: 60 mg via EPIDURAL

## 2017-01-13 MED ORDER — IOPAMIDOL (ISOVUE-M 300) INJECTION 61%
1.0000 mL | Freq: Once | INTRAMUSCULAR | Status: AC | PRN
  Administered 2017-01-13: 1 mL via EPIDURAL

## 2017-01-14 ENCOUNTER — Encounter: Payer: Self-pay | Admitting: Physician Assistant

## 2017-01-14 NOTE — Progress Notes (Signed)
Good morning Adriana Gordon,  Your bone density scan was normal. Can probably resume a normal screening schedule beginning at age 51. Make sure to continue daily calcium and vitamin D supplementation as well as regular weight-bearing exercise for good bone health.  Have a Happy New Year! Adriana Gordon

## 2017-01-24 ENCOUNTER — Other Ambulatory Visit: Payer: Self-pay | Admitting: Sports Medicine

## 2017-01-24 ENCOUNTER — Encounter: Payer: BLUE CROSS/BLUE SHIELD | Admitting: Internal Medicine

## 2017-02-01 ENCOUNTER — Encounter: Payer: Self-pay | Admitting: Sports Medicine

## 2017-02-01 ENCOUNTER — Ambulatory Visit (INDEPENDENT_AMBULATORY_CARE_PROVIDER_SITE_OTHER): Payer: BLUE CROSS/BLUE SHIELD | Admitting: Sports Medicine

## 2017-02-01 DIAGNOSIS — M5030 Other cervical disc degeneration, unspecified cervical region: Secondary | ICD-10-CM

## 2017-02-01 DIAGNOSIS — R030 Elevated blood-pressure reading, without diagnosis of hypertension: Secondary | ICD-10-CM

## 2017-02-01 MED ORDER — CYCLOBENZAPRINE HCL 10 MG PO TABS: 10.0000 mg | ORAL_TABLET | Freq: Three times a day (TID) | ORAL | 11 refills | Status: DC | PRN

## 2017-02-01 NOTE — Progress Notes (Signed)
Subjective:    CC: Follow-up after epidural  HPI: Adriana Gordon is a very pleasant 52 year old female, she has a C5-C7 fusion with adjacent level disease, we did proceed with a cervical epidural that provided good relief until recently.  She is also noting good relief with Flexeril and tramadol.  Happy with current results, does not yet want to proceed to another epidural.  I reviewed the past medical history, family history, social history, surgical history, and allergies today and no changes were needed.  Please see the problem list section below in epic for further details.  Past Medical History: Past Medical History:  Diagnosis Date  . Allergic rhinitis   . Asthma    childhood  . External hemorrhoids   . History of menorrhagia   . 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 Nabothian cyst 07/14/2016  . Ovarian cyst    Past Surgical History: Past Surgical History:  Procedure Laterality Date  . BACK SURGERY    . BREAST SURGERY     reduction  . CESAREAN SECTION    . COLONOSCOPY  2017  . ENDOMETRIAL ABLATION  2006  . REDUCTION MAMMAPLASTY    . TONSILLECTOMY     Social History: Social History   Socioeconomic History  . Marital status: Married    Spouse name: None  . Number of children: 2  . Years of education: None  . Highest education level: None  Social Needs  . Financial resource strain: None  . Food insecurity - worry: None  . Food insecurity - inability: None  . Transportation needs - medical: None  . Transportation needs - non-medical: None  Occupational History  . Occupation: unemployed  Tobacco Use  . Smoking status: Never Smoker  . Smokeless tobacco: Never Used  Substance and Sexual Activity  . Alcohol use: Yes    Comment: socially  . Drug use: No  . Sexual activity: Yes    Birth control/protection: Post-menopausal  Other Topics Concern  . None  Social History Narrative  . None   Family History: Family History  Problem Relation Age of Onset   . Hypertension Mother   . Depression Mother   . Sjogren's syndrome Mother   . Alcohol abuse Mother   . Cancer Father   . Asthma Father   . COPD Father   . Hypertension Father   . Hyperlipidemia Father   . Prostate cancer Father   . Depression Brother   . Diabetes Maternal Grandmother   . Stroke Maternal Grandfather   . Heart attack Paternal Grandfather   . Rheum arthritis Cousin    Allergies: Allergies  Allergen Reactions  . Phenergan [Promethazine Hcl]    Medications: See med rec.  Review of Systems: No fevers, chills, night sweats, weight loss, chest pain, or shortness of breath.   Objective:    General: Well Developed, well nourished, and in no acute distress.  Neuro: Alert and oriented x3, extra-ocular muscles intact, sensation grossly intact.  HEENT: Normocephalic, atraumatic, pupils equal round reactive to light, neck supple, no masses, no lymphadenopathy, thyroid nonpalpable.  Skin: Warm and dry, no rashes. Cardiac: Regular rate and rhythm, no murmurs rubs or gallops, no lower extremity edema.  Respiratory: Clear to auscultation bilaterally. Not using accessory muscles, speaking in full sentences. Neck: Negative spurling's Full neck range of motion Grip strength and sensation normal in bilateral hands Strength good C4 to T1 distribution No sensory change to C4 to T1 Reflexes normal  Impression and Recommendations:  DDD (degenerative disc disease), cervical A single cervical epidural provided good relief, she is having some recurrence of pain. Symptoms are fairly well relieved with tramadol and Flexeril, she is going to continue Flexeril up to 3 times a day, rarely uses tramadol, I am happy to refill this when she wants. We could also proceed with epidural #2, she can just call me for this.  Elevated blood-pressure reading without diagnosis of hypertension Blood pressures have been elevated on multiple occasions, lately she is had a high salt diet, lots of  tea. She tells me that at home her blood pressures are all normal raising the likelihood of whitecoat hypertension. I would like her to go ahead and get several daily readings for the next week or 2 and email them to her PCP. Most of her blood pressures here have been borderline.  I spent 25 minutes with this patient, greater than 50% was face-to-face time counseling regarding the above diagnoses ___________________________________________ Ihor Austinhomas J. Benjamin Stainhekkekandam, M.D., ABFM., CAQSM. Primary Care and Sports Medicine [facility]  Adjunct Instructor of Family Medicine  [employer]

## 2017-02-01 NOTE — Assessment & Plan Note (Signed)
Blood pressures have been elevated on multiple occasions, lately she is had a high salt diet, lots of tea. She tells me that at home her blood pressures are all normal raising the likelihood of whitecoat hypertension. I would like her to go ahead and get several daily readings for the next week or 2 and email them to her PCP. Most of her blood pressures here have been borderline.

## 2017-02-01 NOTE — Assessment & Plan Note (Signed)
A single cervical epidural provided good relief, she is having some recurrence of pain. Symptoms are fairly well relieved with tramadol and Flexeril, she is going to continue Flexeril up to 3 times a day, rarely uses tramadol, I am happy to refill this when she wants. We could also proceed with epidural #2, she can just call me for this.

## 2017-02-09 ENCOUNTER — Encounter: Payer: Self-pay | Admitting: Sports Medicine

## 2017-02-09 DIAGNOSIS — M5030 Other cervical disc degeneration, unspecified cervical region: Secondary | ICD-10-CM

## 2017-02-10 NOTE — Telephone Encounter (Signed)
[facility] notified.  

## 2017-02-10 NOTE — Telephone Encounter (Signed)
Epidural ordered.  [facility]

## 2017-02-14 ENCOUNTER — Ambulatory Visit: Payer: BLUE CROSS/BLUE SHIELD | Admitting: Sports Medicine

## 2017-03-01 ENCOUNTER — Ambulatory Visit
Admission: RE | Admit: 2017-03-01 | Discharge: 2017-03-01 | Disposition: A | Discharge status: Home / Self Care | Payer: BLUE CROSS/BLUE SHIELD | Source: Ambulatory Visit | Attending: Sports Medicine | Admitting: Sports Medicine

## 2017-03-01 DIAGNOSIS — M47812 Spondylosis without myelopathy or radiculopathy, cervical region: Secondary | ICD-10-CM | POA: Diagnosis not present

## 2017-03-01 MED ORDER — TRIAMCINOLONE ACETONIDE 40 MG/ML IJ SUSP (RADIOLOGY)
60.0000 mg | Freq: Once | INTRAMUSCULAR | Status: AC
  Administered 2017-03-01: 60 mg via EPIDURAL

## 2017-03-01 MED ORDER — IOPAMIDOL (ISOVUE-M 300) INJECTION 61%
1.0000 mL | Freq: Once | INTRAMUSCULAR | Status: AC | PRN
  Administered 2017-03-01: 1 mL via EPIDURAL

## 2017-03-01 NOTE — Discharge Instructions (Signed)

## 2017-03-11 ENCOUNTER — Telehealth: Payer: Self-pay

## 2017-03-11 NOTE — Telephone Encounter (Signed)
Adriana Gordon called and states she needs a T-dap. If it is cheaper here patient would like to come in for nurse visit. She will call back after she price checks. If it is cheaper here, can she have the T-dap on a nurse schedule?

## 2017-03-11 NOTE — Telephone Encounter (Signed)
Yes, absolutely.

## 2017-03-16 NOTE — Telephone Encounter (Signed)
Pt advised.

## 2017-03-30 ENCOUNTER — Other Ambulatory Visit: Payer: Self-pay

## 2017-03-30 DIAGNOSIS — M5030 Other cervical disc degeneration, unspecified cervical region: Secondary | ICD-10-CM

## 2017-03-30 DIAGNOSIS — J3089 Other allergic rhinitis: Secondary | ICD-10-CM

## 2017-03-30 MED ORDER — BUPROPION HCL ER (XL) 300 MG PO TB24: 300.0000 mg | ORAL_TABLET | Freq: Every day | ORAL | 3 refills | Status: DC

## 2017-03-30 MED ORDER — FLUTICASONE PROPIONATE 50 MCG/ACT NA SUSP: NASAL | 3 refills | Status: DC

## 2017-03-30 MED ORDER — CYCLOBENZAPRINE HCL 10 MG PO TABS
10.0000 mg | ORAL_TABLET | Freq: Three times a day (TID) | ORAL | 2 refills | Status: DC | PRN
Start: 2017-03-30 — End: 2019-02-16

## 2017-05-09 ENCOUNTER — Encounter: Payer: Self-pay | Admitting: Physician Assistant

## 2017-05-09 DIAGNOSIS — F3341 Major depressive disorder, recurrent, in partial remission: Secondary | ICD-10-CM

## 2017-05-10 MED ORDER — BUPROPION HCL ER (XL) 150 MG PO TB24: 150.0000 mg | ORAL_TABLET | Freq: Every day | ORAL | 3 refills | Status: DC

## 2017-07-25 NOTE — Telephone Encounter (Signed)
Done

## 2017-10-03 ENCOUNTER — Encounter: Payer: BLUE CROSS/BLUE SHIELD | Admitting: Physician Assistant

## 2017-11-08 ENCOUNTER — Encounter: Payer: BLUE CROSS/BLUE SHIELD | Admitting: Physician Assistant

## 2017-11-09 ENCOUNTER — Encounter: Payer: Self-pay | Admitting: Physician Assistant

## 2017-11-09 ENCOUNTER — Ambulatory Visit (INDEPENDENT_AMBULATORY_CARE_PROVIDER_SITE_OTHER): Payer: BLUE CROSS/BLUE SHIELD | Admitting: Physician Assistant

## 2017-11-09 VITALS — BP 151/93 | HR 96 | Wt 133.0 lb

## 2017-11-09 DIAGNOSIS — F909 Attention-deficit hyperactivity disorder, unspecified type: Secondary | ICD-10-CM

## 2017-11-09 DIAGNOSIS — Z131 Encounter for screening for diabetes mellitus: Secondary | ICD-10-CM

## 2017-11-09 DIAGNOSIS — Z13220 Encounter for screening for lipoid disorders: Secondary | ICD-10-CM

## 2017-11-09 DIAGNOSIS — R41840 Attention and concentration deficit: Secondary | ICD-10-CM

## 2017-11-09 DIAGNOSIS — I10 Essential (primary) hypertension: Secondary | ICD-10-CM

## 2017-11-09 DIAGNOSIS — R030 Elevated blood-pressure reading, without diagnosis of hypertension: Secondary | ICD-10-CM | POA: Insufficient documentation

## 2017-11-09 DIAGNOSIS — Z130 Encounter for screening for diseases of the blood and blood-forming organs and certain disorders involving the immune mechanism: Secondary | ICD-10-CM

## 2017-11-09 MED ORDER — LOSARTAN POTASSIUM 50 MG PO TABS: 50.0000 mg | ORAL_TABLET | Freq: Every day | ORAL | 3 refills | Status: DC

## 2017-11-09 MED ORDER — AMPHETAMINE-DEXTROAMPHETAMINE 5 MG PO TABS: 5.0000 mg | ORAL_TABLET | Freq: Two times a day (BID) | ORAL | 0 refills | Status: DC

## 2017-11-09 NOTE — Progress Notes (Signed)
HPI:                                                                Adriana Gordon is a 52 y.o. female who presents to [name]: Primary Care Sports Medicine today for inattention  Pleasant 52 yo F with self-reported history of ADHD presents with inattentive symptoms. States completed psychological testing and she was diagnosed several years ago by Dr. Imogene Burn in [place], [place]. She is currently retired, but has recently become involved in community activities and is a member of a few committees. Reports she is having difficulty with focus/concentratrion and organization, unable to follow projects through to completion. She has taken Adderall 5 mg in the past and this has worked well for her. Did not tolerate higher dose of Wellbutrin (caused flattened affect) and did not tolerate Vyvanse.  Denies depressed mood or anxiety. She does endorse multiple nighttime awakenings, several to hourly, most nights during the week. She attributes this to menopause.   She reports she has been tested and diagnosed with ADHD. She monitoring her BP at home and SBP is 120's, DBP is 80's. HR high 70's-mid 80's    Depression screen [facility] 2/9 11/09/2017 11/19/2016 07/15/2016  Decreased Interest 0 0 0  Down, Depressed, Hopeless 0 0 0  PHQ - 2 Score 0 0 0  Altered sleeping 1 - -  Tired, decreased energy 0 - -  Change in appetite 0 - -  Feeling bad or failure about yourself  0 - -  PHQ-9 Score 1 - -     No flowsheet data found.    Past Medical History:  Diagnosis Date  . Allergic rhinitis   . Asthma    childhood  . External hemorrhoids   . History of menorrhagia   . 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 Nabothian cyst 07/14/2016  . Ovarian cyst    Past Surgical History:  Procedure Laterality Date  . BACK SURGERY    . BREAST SURGERY     reduction  . CESAREAN SECTION    . COLONOSCOPY  2017  . ENDOMETRIAL ABLATION  2006  . REDUCTION MAMMAPLASTY    . TONSILLECTOMY      Social History   Tobacco Use  . Smoking status: Never Smoker  . Smokeless tobacco: Never Used  Substance Use Topics  . Alcohol use: Yes    Comment: socially   family history includes Alcohol abuse in her mother; Asthma in her father; COPD in her father; Cancer in her father; Depression in her brother and mother; Diabetes in her maternal grandmother; Heart attack in her paternal grandfather; Hyperlipidemia in her father; Hypertension in her father and mother; Prostate cancer in her father; Rheum arthritis in her cousin; Sjogren's syndrome in her mother; Stroke in her maternal grandfather.    ROS: negative except as noted in the HPI  Medications: Current Outpatient Medications  Medication Sig Dispense Refill  . amphetamine-dextroamphetamine (ADDERALL) 5 MG tablet Take 1 tablet (5 mg total) by mouth 2 (two) times daily with breakfast and lunch. 60 tablet 0  . buPROPion (WELLBUTRIN XL) 150 MG 24 hr tablet Take 1 tablet (150 mg total) by mouth daily. 90 tablet 3  . Calcium Carbonate-Vitamin D 600-400 MG-UNIT tablet Take  1 tablet by mouth 2 (two) times daily. 60 tablet 11  . Cholecalciferol (VITAMIN D3) 2000 units CHEW Chew by mouth daily.    . cyclobenzaprine (FLEXERIL) 10 MG tablet Take 1 tablet (10 mg total) by mouth 3 (three) times daily as needed for muscle spasms. 270 tablet 2  . fluticasone (FLONASE) 50 MCG/ACT nasal spray One spray in each nostril twice a day, use left hand for right nostril, and right hand for left nostril. 48 g 3  . losartan (COZAAR) 50 MG tablet Take 1 tablet (50 mg total) by mouth daily. 30 tablet 3   No current facility-administered medications for this visit.    Allergies  Allergen Reactions  . Phenergan [Promethazine Hcl]        Objective:  BP (!) 151/93   Pulse 96   Wt 133 lb (60.3 kg)   LMP 12/11/2004   BMI 25.97 kg/m  Gen:  alert, not ill-appearing, no distress, appropriate for age HEENT: head normocephalic without obvious abnormality,  conjunctiva and cornea clear, trachea midline Pulm: Normal work of breathing, normal phonation, clear to auscultation bilaterally, no wheezes, rales or rhonchi CV: Normal rate, regular rhythm, s1 and s2 distinct, no murmurs, clicks or rubs  Neuro: alert and oriented x 3, no tremor MSK: extremities atraumatic, normal gait and station Skin: intact, no rashes on exposed skin, no jaundice, no cyanosis Psych: well-groomed, cooperative, good eye contact, euthymic mood, affect mood-congruent, speech is articulate, and thought processes clear and goal-directed  Vitals:   11/09/17 1307 11/09/17 1344  BP: (!) 166/104 (!) 151/93  Pulse: (!) 105 96     No results found for this or any previous visit (from the past 72 hour(s)). No results found.    Assessment and Plan: 52 y.o. female with   .Diagnoses and all orders for this visit:  Inattention -     amphetamine-dextroamphetamine (ADDERALL) 5 MG tablet; Take 1 tablet (5 mg total) by mouth 2 (two) times daily with breakfast and lunch.  Screening for lipid disorders -     Lipid Panel w/reflex Direct LDL  Screening for diabetes mellitus -     Comprehensive metabolic panel  Screening for blood disease -     Comprehensive metabolic panel -     CBC  Hypertension goal BP (blood pressure) < 130/80 -     losartan (COZAAR) 50 MG tablet; Take 1 tablet (50 mg total) by mouth daily.  Adult ADHD   Requesting [facility] Discussed options to include increasing wellbutrin, augmenting with Straterra, or re-start low dose Adderall with antihypertensive medication and close monitoring. Opted for the latter option. Home BP readings show diastolic HTN in the 80's.  Starting low-dose ARB. Continue to monitor and log BP's at home. No hx of renal insufficiency, due for fasting labs, checking renal function    Patient education and anticipatory guidance given Patient agrees with treatment plan Follow-up in 1 month or sooner as needed if symptoms  worsen or fail to improve  [name] PA-C

## 2017-11-09 NOTE — Patient Instructions (Signed)
I have also ordered fasting labs. The lab is a walk-in open M-F 7:30a-4:30p (closed 12:30-1:30p). Nothing to eat or drink after midnight or at least 8 hours before your blood draw. You can have water and your medications.     

## 2017-11-11 ENCOUNTER — Ambulatory Visit: Payer: BLUE CROSS/BLUE SHIELD | Admitting: Physician Assistant

## 2017-11-26 ENCOUNTER — Encounter: Payer: Self-pay | Admitting: Physician Assistant

## 2017-11-26 DIAGNOSIS — R41840 Attention and concentration deficit: Secondary | ICD-10-CM

## 2017-12-02 MED ORDER — AMPHETAMINE-DEXTROAMPHETAMINE 10 MG PO TABS: 10.0000 mg | ORAL_TABLET | Freq: Two times a day (BID) | ORAL | 0 refills | Status: DC

## 2017-12-06 DIAGNOSIS — Z13220 Encounter for screening for lipoid disorders: Secondary | ICD-10-CM | POA: Diagnosis not present

## 2017-12-06 DIAGNOSIS — Z131 Encounter for screening for diabetes mellitus: Secondary | ICD-10-CM | POA: Diagnosis not present

## 2017-12-06 DIAGNOSIS — Z130 Encounter for screening for diseases of the blood and blood-forming organs and certain disorders involving the immune mechanism: Secondary | ICD-10-CM | POA: Diagnosis not present

## 2017-12-07 ENCOUNTER — Encounter: Payer: Self-pay | Admitting: Physician Assistant

## 2017-12-07 LAB — COMPREHENSIVE METABOLIC PANEL
AG RATIO: 1.8 (calc) (ref 1.0–2.5)
ALT: 9 U/L (ref 6–29)
AST: 11 U/L (ref 10–35)
Albumin: 4.6 g/dL (ref 3.6–5.1)
Alkaline phosphatase (APISO): 59 U/L (ref 33–130)
BILIRUBIN TOTAL: 0.5 mg/dL (ref 0.2–1.2)
BUN: 19 mg/dL (ref 7–25)
CO2: 25 mmol/L (ref 20–32)
Calcium: 9.9 mg/dL (ref 8.6–10.4)
Chloride: 101 mmol/L (ref 98–110)
Creat: 1.03 mg/dL (ref 0.50–1.05)
GLOBULIN: 2.5 g/dL (ref 1.9–3.7)
GLUCOSE: 84 mg/dL (ref 65–99)
Potassium: 3.9 mmol/L (ref 3.5–5.3)
Sodium: 136 mmol/L (ref 135–146)
TOTAL PROTEIN: 7.1 g/dL (ref 6.1–8.1)

## 2017-12-07 LAB — CBC
HEMATOCRIT: 39.5 % (ref 35.0–45.0)
HEMOGLOBIN: 13.4 g/dL (ref 11.7–15.5)
MCH: 31.3 pg (ref 27.0–33.0)
MCHC: 33.9 g/dL (ref 32.0–36.0)
MCV: 92.3 fL (ref 80.0–100.0)
MPV: 11 fL (ref 7.5–12.5)
Platelets: 323 10*3/uL (ref 140–400)
RBC: 4.28 10*6/uL (ref 3.80–5.10)
RDW: 13.1 % (ref 11.0–15.0)
WBC: 7.5 10*3/uL (ref 3.8–10.8)

## 2017-12-07 LAB — LIPID PANEL W/REFLEX DIRECT LDL
CHOL/HDL RATIO: 3.4 (calc) (ref <5.0)
CHOLESTEROL: 205 mg/dL — AB (ref <200)
HDL: 60 mg/dL (ref >50)
LDL CHOLESTEROL (CALC): 125 mg/dL — AB
Non-HDL Cholesterol (Calc): 145 mg/dL (calc) — ABNORMAL HIGH (ref <130)
TRIGLYCERIDES: 94 mg/dL (ref <150)

## 2017-12-08 ENCOUNTER — Ambulatory Visit: Payer: BLUE CROSS/BLUE SHIELD | Admitting: Physician Assistant

## 2017-12-17 ENCOUNTER — Other Ambulatory Visit: Payer: Self-pay | Admitting: Sports Medicine

## 2017-12-17 DIAGNOSIS — J3089 Other allergic rhinitis: Secondary | ICD-10-CM

## 2017-12-18 NOTE — Telephone Encounter (Signed)
To PCP

## 2018-01-09 ENCOUNTER — Other Ambulatory Visit: Payer: Self-pay

## 2018-01-09 ENCOUNTER — Other Ambulatory Visit: Payer: Self-pay | Admitting: Physician Assistant

## 2018-01-09 DIAGNOSIS — R41840 Attention and concentration deficit: Secondary | ICD-10-CM

## 2018-01-09 MED ORDER — AMPHETAMINE-DEXTROAMPHETAMINE 10 MG PO TABS: 10.0000 mg | ORAL_TABLET | Freq: Two times a day (BID) | ORAL | 0 refills | Status: DC

## 2018-01-09 NOTE — Telephone Encounter (Signed)
Requesting RF on Adderall   Last RX sent 12/02/17 for #60   RX pended, please review and send if appropriate  PCP out of office, sending to covering provider

## 2018-01-09 NOTE — Progress Notes (Signed)
Done

## 2018-01-09 NOTE — Telephone Encounter (Signed)
Patient called back because she is out of town and would like the prescription of Adderall to [facility] [place] [place]. Please advise.

## 2018-01-31 ENCOUNTER — Encounter: Payer: Self-pay | Admitting: Physician Assistant

## 2018-01-31 ENCOUNTER — Ambulatory Visit (INDEPENDENT_AMBULATORY_CARE_PROVIDER_SITE_OTHER): Payer: BLUE CROSS/BLUE SHIELD | Admitting: Physician Assistant

## 2018-01-31 ENCOUNTER — Other Ambulatory Visit: Payer: Self-pay

## 2018-01-31 VITALS — BP 134/79 | HR 91 | Temp 98.1°F | Wt 126.0 lb

## 2018-01-31 DIAGNOSIS — J45909 Unspecified asthma, uncomplicated: Secondary | ICD-10-CM | POA: Insufficient documentation

## 2018-01-31 DIAGNOSIS — J4520 Mild intermittent asthma, uncomplicated: Secondary | ICD-10-CM

## 2018-01-31 DIAGNOSIS — F909 Attention-deficit hyperactivity disorder, unspecified type: Secondary | ICD-10-CM | POA: Diagnosis not present

## 2018-01-31 DIAGNOSIS — I10 Essential (primary) hypertension: Secondary | ICD-10-CM

## 2018-01-31 DIAGNOSIS — J3089 Other allergic rhinitis: Secondary | ICD-10-CM | POA: Diagnosis not present

## 2018-01-31 DIAGNOSIS — H6993 Unspecified Eustachian tube disorder, bilateral: Secondary | ICD-10-CM | POA: Diagnosis not present

## 2018-01-31 MED ORDER — LOSARTAN POTASSIUM 50 MG PO TABS: 50.0000 mg | ORAL_TABLET | Freq: Every day | ORAL | 1 refills | Status: DC

## 2018-01-31 MED ORDER — IPRATROPIUM BROMIDE 0.06 % NA SOLN: 2.0000 | Freq: Four times a day (QID) | NASAL | 0 refills | Status: DC | PRN

## 2018-01-31 MED ORDER — FEXOFENADINE HCL 180 MG PO TABS: 180.0000 mg | ORAL_TABLET | Freq: Every day | ORAL | 3 refills | Status: DC

## 2018-01-31 MED ORDER — MOMETASONE FUROATE 50 MCG/ACT NA SUSP: NASAL | 6 refills | Status: DC

## 2018-01-31 MED ORDER — MONTELUKAST SODIUM 10 MG PO TABS: 10.0000 mg | ORAL_TABLET | Freq: Every day | ORAL | 3 refills | Status: DC

## 2018-01-31 MED ORDER — AMPHETAMINE-DEXTROAMPHETAMINE 10 MG PO TABS: 10.0000 mg | ORAL_TABLET | Freq: Two times a day (BID) | ORAL | 0 refills | Status: DC

## 2018-01-31 MED ORDER — ALBUTEROL SULFATE 108 (90 BASE) MCG/ACT IN AEPB: 1.0000 | INHALATION_SPRAY | RESPIRATORY_TRACT | 3 refills | Status: DC | PRN

## 2018-01-31 NOTE — Progress Notes (Signed)
HPI:                                                                Adriana Gordon is a 53 y.o. female who [facility] today for medication management  HTN: taking Losartan 50 mg daily. Compliant with medications. Does not check BP's at home. Reports she did check today and it was 117/78. Denies vision change, headache, chest pain with exertion, orthopnea, lightheadedness, syncope and edema. Risk factors include: HLD, postmenopausal  ADHD: diagnosed at [facility], [place], [place], in 2017. Reports  Adderall 10 mg twice a day does not consistently help her, but overall she is pleased.  She is not employed and currently spends her free time on multiple neighborhood committees and hobbies. Prior meds: She has taken Adderall 5 mg in the past and this has worked well for her. Did not tolerate higher dose of Wellbutrin (caused flattened affect) and did not tolerate Vyvanse.  Allergies/Asthma: reports she is having nightly post-nasal drip and cough that is not relieved by Flonase. She also endorses chronic ear fullness and intermittent popping. She has been using OTC cough suppressants without relief. She has done immunotherapy for allergies in the past. She has occasional chest tightness. States she has not had an asthma attack since she was a child. Does not have a rescue inhaler currently. Reports allergies are a trigger for her asthma.   Past Medical History:  Diagnosis Date  . Allergic rhinitis   . Asthma    childhood  . External hemorrhoids   . History of menorrhagia   . 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 Nabothian cyst 07/14/2016  . Ovarian cyst    Past Surgical History:  Procedure Laterality Date  . BACK SURGERY    . BREAST SURGERY     reduction  . CESAREAN SECTION    . COLONOSCOPY  2017  . ENDOMETRIAL ABLATION  2006  . REDUCTION MAMMAPLASTY    . TONSILLECTOMY     Social History    Tobacco Use  . Smoking status: Never Smoker  . Smokeless tobacco: Never Used  Substance Use Topics  . Alcohol use: Yes    Comment: socially   family history includes Alcohol abuse in her mother; Asthma in her father; COPD in her father; Cancer in her father; Depression in her brother and mother; Diabetes in her maternal grandmother; Heart attack in her paternal grandfather; Hyperlipidemia in her father; Hypertension in her father and mother; Prostate cancer in her father; Rheum arthritis in her cousin; Sjogren's syndrome in her mother; Stroke in her maternal grandfather.    ROS: negative except as noted in the HPI  Medications: Current Outpatient Medications  Medication Sig Dispense Refill  . Albuterol Sulfate (PROAIR RESPICLICK) 108 (90 Base) MCG/ACT AEPB Inhale 1-2 puffs into the lungs every 4 (four) hours as needed (wheezing/shortness of breath). 1 each 3  . amphetamine-dextroamphetamine (ADDERALL) 10 MG tablet Take 1 tablet (10 mg total) by mouth 2 (two) times daily. 60 tablet 0  . buPROPion (WELLBUTRIN XL) 150 MG 24 hr tablet Take 1 tablet (150 mg total) by mouth daily. 90 tablet 3  . Calcium Carbonate-Vitamin D 600-400 MG-UNIT tablet Take 1 tablet by mouth  2 (two) times daily. 60 tablet 11  . Cholecalciferol (VITAMIN D3) 2000 units CHEW Chew by mouth daily.    . cyclobenzaprine (FLEXERIL) 10 MG tablet Take 1 tablet (10 mg total) by mouth 3 (three) times daily as needed for muscle spasms. 270 tablet 2  . fexofenadine (ALLEGRA) 180 MG tablet Take 1 tablet (180 mg total) by mouth at bedtime. 90 tablet 3  . ipratropium (ATROVENT) 0.06 % nasal spray Place 2 sprays into both nostrils 4 (four) times daily as needed for rhinitis. 15 mL 0  . losartan (COZAAR) 50 MG tablet Take 1 tablet (50 mg total) by mouth daily. 30 tablet 3  . mometasone (NASONEX) 50 MCG/ACT nasal spray One spray in each nostril twice a day, use left hand for right nostril, and right hand for left nostril.  Please  dispense one bottle. 1 g 6  . montelukast (SINGULAIR) 10 MG tablet Take 1 tablet (10 mg total) by mouth at bedtime. 90 tablet 3   No current facility-administered medications for this visit.    Allergies  Allergen Reactions  . Phenergan [Promethazine Hcl]        Objective:  BP 134/79   Pulse 100   Temp 98.1 F (36.7 C) (Oral)   Wt 126 lb (57.2 kg)   LMP 12/11/2004   SpO2 100%   BMI 24.61 kg/m  Gen:  alert, not ill-appearing, no distress, appropriate for age HEENT: head normocephalic without obvious abnormality, conjunctiva and cornea clear, TM's pearly gray and semi-transparent bilaterally, external ear canals normal bilaterally, hearing intact to finger rub, nasal mucosa pink, oropharynx clear, uvula midline, neck supple, no cervical adenopathy, trachea midline Pulm: Normal work of breathing, normal phonation, clear to auscultation bilaterally, no wheezes, rales or rhonchi CV: Normal rate, regular rhythm, s1 and s2 distinct, no murmurs, clicks or rubs  Neuro: alert and oriented x 3, no tremor MSK: extremities atraumatic, normal gait and station Skin: intact, no rashes on exposed skin, no jaundice, no cyanosis Psych: well-groomed, cooperative, good eye contact, euthymic mood, affect mood-congruent, speech is articulate, and thought processes clear and goal-directed   BP Readings from Last 3 Encounters:  01/31/18 134/79  11/09/17 (!) 151/93  03/01/17 (!) 159/88   Lab Results  Component Value Date   CREATININE 1.03 12/06/2017   BUN 19 12/06/2017   NA 136 12/06/2017   K 3.9 12/06/2017   CL 101 12/06/2017   CO2 25 12/06/2017   Lab Results  Component Value Date   CHOL 205 (H) 12/06/2017   HDL 60 12/06/2017   LDLCALC 125 (H) 12/06/2017   TRIG 94 12/06/2017   CHOLHDL 3.4 12/06/2017   The 10-year ASCVD risk score Denman George DC Jr., et al., 2013) is: 2.3%   Values used to calculate the score:     Age: 26 years     Sex: Female     Is Non-Hispanic African American: No      Diabetic: No     Tobacco smoker: No     Systolic Blood Pressure: 134 mmHg     Is BP treated: Yes     HDL Cholesterol: 60 mg/dL     Total Cholesterol: 205 mg/dL   No results found for this or any previous visit (from the past 72 hour(s)). No results found.    Assessment and Plan: 53 y.o. female with   .Denece was seen today for hypertension and medication management.  Diagnoses and all orders for this visit:  Mild intermittent extrinsic asthma without complication -  montelukast (SINGULAIR) 10 MG tablet; Take 1 tablet (10 mg total) by mouth at bedtime. -     Albuterol Sulfate (PROAIR RESPICLICK) 108 (90 Base) MCG/ACT AEPB; Inhale 1-2 puffs into the lungs every 4 (four) hours as needed (wheezing/shortness of breath).  Perennial allergic rhinitis -     fexofenadine (ALLEGRA) 180 MG tablet; Take 1 tablet (180 mg total) by mouth at bedtime. -     montelukast (SINGULAIR) 10 MG tablet; Take 1 tablet (10 mg total) by mouth at bedtime. -     ipratropium (ATROVENT) 0.06 % nasal spray; Place 2 sprays into both nostrils 4 (four) times daily as needed for rhinitis. -     mometasone (NASONEX) 50 MCG/ACT nasal spray; One spray in each nostril twice a day, use left hand for right nostril, and right hand for left nostril.  Please dispense one bottle.  Eustachian tube disorder, bilateral  Inattention -     amphetamine-dextroamphetamine (ADDERALL) 10 MG tablet; Take 1 tablet (10 mg total) by mouth 2 (two) times daily.  Hypertension goal BP (blood pressure) < 130/80 -     losartan (COZAAR) 50 MG tablet; Take 1 tablet (50 mg total) by mouth daily.   Hypertension Blood pressure out of range on initial check, systolic blood pressure in stage I hypertensive range on recheck.  Patient reports home blood pressure was in range today, but she does not consistently monitor her blood pressure Plan to continue losartan 50 mg daily 10-year ASCVD risk is 2.3% Counseled on therapeutic lifestyle  changes  ADHD Heart rate and blood pressure increased on initial exam, improved on recheck. Patient requested to increase her dose of Adderall.  I advised against this. I do not see benefits outweighing the risk in her case.  Encouraged her to continue to monitor her blood pressure closely at home Refill provided for max dose 10 mg twice daily Continue Wellbutrin 150 mg daily  Allergic rhinitis, allergic asthma Afebrile, no tachypnea, no tachycardia, pulse ox 100% on room air at rest, no adventitious lung sounds Starting Allegra and Singulair  Switching from Flonase to Nasonex and Atrovent.  She was counseled that she could experiment with both of these nasal sprays to see which one is most effective for her: She was also advised that she could do both. Prescription written for rescue inhaler.  May use albuterol 1 to 2 puffs every 4-6 hours as needed for chest tightness, wheezing, shortness of breath   Patient education and anticipatory guidance given Patient agrees with treatment plan Follow-up in 3 months or sooner as needed if symptoms worsen or fail to improve  [name] PA-C

## 2018-01-31 NOTE — Patient Instructions (Signed)
Asthma Attack Prevention, Adult  Although you may not be able to control the fact that you have asthma, you can take actions to prevent episodes of asthma (asthma attacks). These actions include:  · Creating a written plan for managing and treating your asthma attacks (asthma action plan).  · Monitoring your asthma.  · Avoiding things that can irritate your airways or make your asthma symptoms worse (asthma triggers).  · Taking your medicines as directed.  · Acting quickly if you have signs or symptoms of an asthma attack.  What are some ways to prevent an asthma attack?  Create a plan  Work with your health care provider to create an asthma action plan. This plan should include:  · A list of your asthma triggers and how to avoid them.  · A list of symptoms that you experience during an asthma attack.  · Information about when to take medicine and how much medicine to take.  · Information to help you understand your peak flow measurements.  · Contact information for your health care providers.  · Daily actions that you can take to control asthma.  Monitor your asthma  To monitor your asthma:  · Use your peak flow meter every morning and every evening for 2-3 weeks. Record the results in a journal. A drop in your peak flow numbers on one or more days may mean that you are starting to have an asthma attack, even if you are not having symptoms.  · When you have asthma symptoms, write them down in a journal.    Avoid asthma triggers  Work with your health care provider to find out what your asthma triggers are. This can be done by:  · Being tested for allergies.  · Keeping a journal that notes when asthma attacks occur and what may have contributed to them.  · Asking your health care provider whether other medical conditions make your asthma worse.  Common asthma triggers include:  · Dust.  · Smoke. This includes campfire smoke and secondhand smoke from tobacco products.  · Pet dander.  · Trees, grasses or  pollens.  · Very cold, dry, or humid air.  · Mold.  · Foods that contain high amounts of sulfites.  · Strong smells.  · Engine exhaust and air pollution.  · Aerosol sprays and fumes from household cleaners.  · Household pests and their droppings, including dust mites and cockroaches.  · Certain medicines, including NSAIDs.  Once you have determined your asthma triggers, take steps to avoid them. Depending on your triggers, you may be able to reduce the chance of an asthma attack by:  · Keeping your home clean. Have someone dust and vacuum your home for you 1 or 2 times a week. If possible, have them use a high-efficiency particulate arrestance (HEPA) vacuum.  · Washing your sheets weekly in hot water.  · Using allergy-proof mattress covers and casings on your bed.  · Keeping pets out of your home.  · Taking care of mold and water problems in your home.  · Avoiding areas where people smoke.  · Avoiding using strong perfumes or odor sprays.  · Avoid spending a lot of time outdoors when pollen counts are high and on very windy days.  · Talking with your health care provider before stopping or starting any new medicines.  Medicines  Take over-the-counter and prescription medicines only as told by your health care provider. Many asthma attacks can be prevented by carefully following your   medicine schedule. Taking your medicines correctly is especially important when you cannot avoid certain asthma triggers. Even if you are doing well, do not stop taking your medicine and do not take less medicine.  Act quickly  If an asthma attack happens, acting quickly can decrease how severe it is and how long it lasts. Take these actions:  · Pay attention to your symptoms. If you are coughing, wheezing, or having difficulty breathing, do not wait to see if your symptoms go away on their own. Follow your asthma action plan.  · If you have followed your asthma action plan and your symptoms are not improving, call your health care  provider or seek immediate medical care at the nearest hospital.  It is important to write down how often you need to use your fast-acting rescue inhaler. You can track how often you use an inhaler in your journal. If you are using your rescue inhaler more often, it may mean that your asthma is not under control. Adjusting your asthma treatment plan may help you to prevent future asthma attacks and help you to gain better control of your condition.  How can I prevent an asthma attack when I exercise?  Exercise is a common asthma trigger. To prevent asthma attacks during exercise:  · Follow advice from your health care provider about whether you should use your fast-acting inhaler before exercising. Many people with asthma experience exercise-induced bronchoconstriction (EIB). This condition often worsens during vigorous exercise in cold, humid, or dry environments. Usually, people with EIB can stay very active by using a fast-acting inhaler before exercising.  · Avoid exercising outdoors in very cold or humid weather.  · Avoid exercising outdoors when pollen counts are high.  · Warm up and cool down when exercising.  · Stop exercising right away if asthma symptoms start.  Consider taking part in exercises that are less likely to cause asthma symptoms such as:  · Indoor swimming.  · Biking.  · Walking.  · Hiking.  · Playing football.  This information is not intended to replace advice given to you by your health care provider. Make sure you discuss any questions you have with your health care provider.  Document Released: 12/23/2008 Document Revised: 09/05/2015 Document Reviewed: 06/21/2015  Elsevier Interactive Patient Education © 2019 [employer] Inc.

## 2018-02-03 ENCOUNTER — Encounter: Payer: Self-pay | Admitting: Physician Assistant

## 2018-02-03 DIAGNOSIS — I10 Essential (primary) hypertension: Secondary | ICD-10-CM

## 2018-02-03 DIAGNOSIS — J3089 Other allergic rhinitis: Secondary | ICD-10-CM

## 2018-02-03 DIAGNOSIS — F909 Attention-deficit hyperactivity disorder, unspecified type: Secondary | ICD-10-CM

## 2018-02-03 DIAGNOSIS — J4520 Mild intermittent asthma, uncomplicated: Secondary | ICD-10-CM

## 2018-02-03 MED ORDER — MOMETASONE FUROATE 50 MCG/ACT NA SUSP: NASAL | 6 refills | Status: DC

## 2018-02-03 MED ORDER — IPRATROPIUM BROMIDE 0.06 % NA SOLN: 2.0000 | Freq: Four times a day (QID) | NASAL | 0 refills | Status: DC | PRN

## 2018-02-03 MED ORDER — LOSARTAN POTASSIUM 50 MG PO TABS: 50.0000 mg | ORAL_TABLET | Freq: Every day | ORAL | 1 refills | Status: DC

## 2018-02-03 MED ORDER — AMPHETAMINE-DEXTROAMPHETAMINE 10 MG PO TABS: 10.0000 mg | ORAL_TABLET | Freq: Two times a day (BID) | ORAL | 0 refills | Status: DC

## 2018-02-03 MED ORDER — FEXOFENADINE HCL 180 MG PO TABS: 180.0000 mg | ORAL_TABLET | Freq: Every day | ORAL | 3 refills | Status: DC

## 2018-02-03 MED ORDER — ALBUTEROL SULFATE 108 (90 BASE) MCG/ACT IN AEPB: 1.0000 | INHALATION_SPRAY | RESPIRATORY_TRACT | 3 refills | Status: DC | PRN

## 2018-02-03 MED ORDER — MONTELUKAST SODIUM 10 MG PO TABS: 10.0000 mg | ORAL_TABLET | Freq: Every day | ORAL | 3 refills | Status: DC

## 2018-02-03 NOTE — Telephone Encounter (Signed)
Called [facility], spoke Gunnar Fusi, all Rx's sent on 01/31/18 cancelled.    Pended Rx's to be sent to local as requested. Chart updated.

## 2018-02-07 ENCOUNTER — Other Ambulatory Visit: Payer: Self-pay | Admitting: Physician Assistant

## 2018-02-07 DIAGNOSIS — I10 Essential (primary) hypertension: Secondary | ICD-10-CM

## 2018-02-08 ENCOUNTER — Other Ambulatory Visit: Payer: Self-pay

## 2018-02-08 DIAGNOSIS — I10 Essential (primary) hypertension: Secondary | ICD-10-CM

## 2018-02-08 MED ORDER — LOSARTAN POTASSIUM 100 MG PO TABS: 50.0000 mg | ORAL_TABLET | Freq: Every day | ORAL | 1 refills | Status: DC

## 2018-02-27 ENCOUNTER — Emergency Department (INDEPENDENT_AMBULATORY_CARE_PROVIDER_SITE_OTHER)
Admission: EM | Admit: 2018-02-27 | Discharge: 2018-02-27 | Disposition: A | Discharge status: Home / Self Care | Payer: BLUE CROSS/BLUE SHIELD | Source: Home / Self Care | Attending: Family Medicine | Admitting: Family Medicine

## 2018-02-27 ENCOUNTER — Other Ambulatory Visit: Payer: Self-pay

## 2018-02-27 DIAGNOSIS — S61211A Laceration without foreign body of left index finger without damage to nail, initial encounter: Secondary | ICD-10-CM | POA: Diagnosis not present

## 2018-02-27 NOTE — ED Provider Notes (Signed)
[facility]    CSN: [id number] Arrival date & time: 02/27/18  1942     History   Chief Complaint Chief Complaint  Patient presents with  . Laceration    HPI Adriana Gordon is a 53 y.o. female.   Patient lacerated her left second finger one hour ago while cleaning a stick blender.  Her Tdap is current.  The history is provided by the patient and the spouse.  Laceration  Location:  Finger Finger laceration location:  L index finger Length:  8mm Depth:  Cutaneous Quality: straight   Bleeding: controlled   Time since incident:  1 hour Laceration mechanism:  Metal edge Pain details:    Quality:  Aching   Severity:  Mild   Timing:  Constant   Progression:  Improving Foreign body present:  No foreign bodies Relieved by:  None tried Worsened by:  Movement Ineffective treatments:  None tried Tetanus status:  Up to date Associated symptoms: no numbness     Past Medical History:  Diagnosis Date  . Allergic rhinitis   . Asthma    childhood  . External hemorrhoids   . History of menorrhagia   . 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 Nabothian cyst 07/14/2016  . Ovarian cyst     Patient Active Problem List   Diagnosis Date Noted  . Hypertension goal BP (blood pressure) < 130/80 01/31/2018  . Extrinsic asthma 01/31/2018  . Eustachian tube disorder, bilateral 01/31/2018  . Adult ADHD 11/09/2017  . White coat syndrome with diagnosis of hypertension 11/09/2017  . Preventive measure 01/03/2017  . DDD (degenerative disc disease), cervical 12/17/2016  . Primary osteoarthritis of first carpometacarpal joint of left hand 11/19/2016  . Perennial allergic rhinitis 11/19/2016  . Borderline hypercholesterolemia 07/15/2016  . Nabothian cyst 07/14/2016    Past Surgical History:  Procedure Laterality Date  . BACK SURGERY    . BREAST SURGERY     reduction  . CESAREAN SECTION    . COLONOSCOPY  2017  . ENDOMETRIAL ABLATION  2006  . REDUCTION  MAMMAPLASTY    . TONSILLECTOMY      OB History    Gravida  2   Para  2   Term  2   Preterm      AB      Living  2     SAB      TAB      Ectopic      Multiple      Live Births               Home Medications    Prior to Admission medications   Medication Sig Start Date End Date Taking? Authorizing Provider  Albuterol Sulfate (PROAIR RESPICLICK) 108 (90 Base) MCG/ACT AEPB Inhale 1-2 puffs into the lungs every 4 (four) hours as needed (wheezing/shortness of breath). 02/03/18   Carlis Stableummings, Charley Elizabeth, PA-C  amphetamine-dextroamphetamine (ADDERALL) 10 MG tablet Take 1 tablet (10 mg total) by mouth 2 (two) times daily. 02/03/18   Carlis Stableummings, Charley Elizabeth, PA-C  buPROPion (WELLBUTRIN XL) 150 MG 24 hr tablet Take 1 tablet (150 mg total) by mouth daily. 05/10/17   Carlis Stableummings, Charley Elizabeth, PA-C  Calcium Carbonate-Vitamin D 600-400 MG-UNIT tablet Take 1 tablet by mouth 2 (two) times daily. 07/15/16   Carlis Stableummings, Charley Elizabeth, PA-C  Cholecalciferol (VITAMIN D3) 2000 units CHEW Chew by mouth daily.    [provider]  cyclobenzaprine (FLEXERIL) 10 MG tablet Take 1 tablet (10 mg  total) by mouth 3 (three) times daily as needed for muscle spasms. 03/30/17   Monica Becton, MD  fexofenadine (ALLEGRA) 180 MG tablet Take 1 tablet (180 mg total) by mouth at bedtime. 02/03/18   Carlis Stable, PA-C  ipratropium (ATROVENT) 0.06 % nasal spray Place 2 sprays into both nostrils 4 (four) times daily as needed for rhinitis. 02/03/18   Carlis Stable, PA-C  losartan (COZAAR) 100 MG tablet Take 0.5 tablets (50 mg total) by mouth daily. 02/08/18   Carlis Stable, PA-C  losartan (COZAAR) 50 MG tablet Take 1 tablet (50 mg total) by mouth daily. 02/03/18   Carlis Stable, PA-C  mometasone (NASONEX) 50 MCG/ACT nasal spray One spray in each nostril twice a day, use left hand for right nostril, and right hand for left nostril.   Please dispense one bottle. 02/03/18   Carlis Stable, PA-C  montelukast (SINGULAIR) 10 MG tablet Take 1 tablet (10 mg total) by mouth at bedtime. 02/03/18   Carlis Stable, PA-C    Family History Family History  Problem Relation Age of Onset  . Hypertension Mother   . Depression Mother   . Sjogren's syndrome Mother   . Alcohol abuse Mother   . Cancer Father   . Asthma Father   . COPD Father   . Hypertension Father   . Hyperlipidemia Father   . Prostate cancer Father   . Depression Brother   . Diabetes Maternal Grandmother   . Stroke Maternal Grandfather   . Heart attack Paternal Grandfather   . Rheum arthritis Cousin     Social History Social History   Tobacco Use  . Smoking status: Never Smoker  . Smokeless tobacco: Never Used  Substance Use Topics  . Alcohol use: Yes    Comment: socially  . Drug use: No     Allergies   Phenergan [promethazine hcl]   Review of Systems Review of Systems  All other systems reviewed and are negative.    Physical Exam Triage Vital Signs ED Triage Vitals  Enc Vitals Group     BP 02/27/18 1954 (!) 166/113     Pulse Rate 02/27/18 1954 96     Resp 02/27/18 1954 18     Temp 02/27/18 1954 98.1 F (36.7 C)     Temp Source 02/27/18 1954 Oral     SpO2 02/27/18 1954 100 %     Weight 02/27/18 1955 122 lb (55.3 kg)     Height 02/27/18 1955 5' (1.524 m)     Head Circumference --      Peak Flow --      Pain Score 02/27/18 1955 1     Pain Loc --      Pain Edu? --      Excl. in GC? --    No data found.  Updated Vital Signs BP (!) 166/113 (BP Location: Right Arm)   Pulse 96   Temp 98.1 F (36.7 C) (Oral)   Resp 18   Ht 5' (1.524 m)   Wt 55.3 kg   LMP 12/11/2004   SpO2 100%   BMI 23.83 kg/m   Visual Acuity Right Eye Distance:   Left Eye Distance:   Bilateral Distance:    Right Eye Near:   Left Eye Near:    Bilateral Near:     Physical Exam Vitals signs and nursing note reviewed.    Constitutional:      General: She is not in acute distress. HENT:  Head: Normocephalic.  Eyes:     Pupils: Pupils are equal, round, and reactive to light.  Cardiovascular:     Rate and Rhythm: Normal rate.  Pulmonary:     Effort: Pulmonary effort is normal.  Musculoskeletal:     Left hand: She exhibits tenderness and laceration. She exhibits normal range of motion, no bony tenderness, normal two-point discrimination, normal capillary refill, no deformity and no swelling. Decreased sensation noted.       Hands:     Comments: Left second finger distal phalanx has two shallow lacerations about 24mm long, and several superficial abrasions present.  DIP joint has full range of motion.  Skin:    General: Skin is warm and dry.  Neurological:     Mental Status: She is alert.      UC Treatments / Results  Labs (all labs ordered are listed, but only abnormal results are displayed) Labs Reviewed - No data to display  EKG None  Radiology No results found.  Procedures Procedures  Laceration Repair (Dermabond) Discussed benefits and risks of procedure and verbal consent obtained. Using sterile technique, cleansed wounds with Betadine followed by lavage with normal saline.  Wounds carefully inspected for debris and foreign bodies; none found.  Wound edges of two shallow lacerations carefully approximated in normal anatomic position and closed with Dermabond.  Several small abrasions also coated with Dermabond.  Wound precautions explained to patient.     Medications Ordered in UC Medications - No data to display  Initial Impression / Assessment and Plan / UC Course  I have reviewed the triage vital signs and the nursing notes.  Pertinent labs & imaging results that were available during my care of the patient were reviewed by me and considered in my medical decision making (see chart for details).       Final Clinical Impressions(s) / UC Diagnoses   Final diagnoses:   Laceration of left index finger without foreign body without damage to nail, initial encounter     Discharge Instructions     Keep wound clean and dry.  Return for any signs of infection (or follow-up with family doctor):  Increasing redness, swelling, pain, heat, drainage, etc. Follow instructions on Dermabond information sheet.     ED Prescriptions    None        Lattie Haw, MD 03/02/18 2049

## 2018-02-27 NOTE — Discharge Instructions (Addendum)
Keep wound clean and dry.  Return for any signs of infection (or follow-up with family doctor):  Increasing redness, swelling, pain, heat, drainage, etc. °Follow instructions on Dermabond information sheet.  °

## 2018-02-27 NOTE — ED Triage Notes (Signed)
About 6:50 pt was cleaning a stick blender, and thought it was unplugged, and cut pointer finger on left hand.  Tdap is up to date.

## 2018-03-07 ENCOUNTER — Other Ambulatory Visit: Payer: Self-pay | Admitting: Physician Assistant

## 2018-03-07 DIAGNOSIS — F909 Attention-deficit hyperactivity disorder, unspecified type: Secondary | ICD-10-CM

## 2018-03-08 MED ORDER — AMPHETAMINE-DEXTROAMPHETAMINE 10 MG PO TABS: 10.0000 mg | ORAL_TABLET | Freq: Two times a day (BID) | ORAL | 0 refills | Status: DC

## 2018-03-17 ENCOUNTER — Encounter: Payer: Self-pay | Admitting: Physician Assistant

## 2018-04-05 ENCOUNTER — Other Ambulatory Visit: Payer: Self-pay | Admitting: Physician Assistant

## 2018-04-05 DIAGNOSIS — F909 Attention-deficit hyperactivity disorder, unspecified type: Secondary | ICD-10-CM

## 2018-04-05 MED ORDER — AMPHETAMINE-DEXTROAMPHETAMINE 10 MG PO TABS: 10.0000 mg | ORAL_TABLET | Freq: Two times a day (BID) | ORAL | 0 refills | Status: DC

## 2018-05-01 ENCOUNTER — Encounter: Payer: Self-pay | Admitting: Physician Assistant

## 2018-05-01 ENCOUNTER — Telehealth (INDEPENDENT_AMBULATORY_CARE_PROVIDER_SITE_OTHER): Payer: BLUE CROSS/BLUE SHIELD | Admitting: Physician Assistant

## 2018-05-01 VITALS — BP 104/62 | HR 96 | Temp 97.0°F | Wt 124.0 lb

## 2018-05-01 DIAGNOSIS — I10 Essential (primary) hypertension: Secondary | ICD-10-CM

## 2018-05-01 DIAGNOSIS — Z1231 Encounter for screening mammogram for malignant neoplasm of breast: Secondary | ICD-10-CM | POA: Diagnosis not present

## 2018-05-01 DIAGNOSIS — F909 Attention-deficit hyperactivity disorder, unspecified type: Secondary | ICD-10-CM | POA: Diagnosis not present

## 2018-05-01 DIAGNOSIS — J4520 Mild intermittent asthma, uncomplicated: Secondary | ICD-10-CM | POA: Diagnosis not present

## 2018-05-01 MED ORDER — LOSARTAN POTASSIUM 50 MG PO TABS: 50.0000 mg | ORAL_TABLET | Freq: Every day | ORAL | 1 refills | Status: DC

## 2018-05-01 MED ORDER — AMPHETAMINE-DEXTROAMPHETAMINE 10 MG PO TABS: 10.0000 mg | ORAL_TABLET | Freq: Two times a day (BID) | ORAL | 0 refills | Status: DC

## 2018-05-01 NOTE — Progress Notes (Signed)
Virtual Visit via Video Note  I connected with Adriana Gordon on 05/01/18 at 10:50 AM EDT by a video enabled telemedicine application and verified that I am speaking with the correct person using two identifiers.   I discussed the limitations of evaluation and management by telemedicine and the availability of in person appointments. The patient expressed understanding and agreed to proceed.  History of Present Illness:                                                              HTN: taking Losartan 50 mg daily. Compliant with medications. Checks BP's at home. BP range 100-120/70's. She is physically active walking and doing yoga daily. Denies vision change, headache, dizziness, chest pain with exertion, orthopnea, lightheadedness, syncope and edema. Risk factors include: postmenopausal  ADHD: doing well on Wellbutrin 150 mg and Adderall 10 mg bid. Reports she cut her hand in February while washing a dish and attributed this to her easy distractability. Denies depressed mood, mood lability, sleep disturbance. Denies CP, palpitations or irregular heart beats.  Allergies/Asthma: reports allergies are a trigger for her asthma. Allergies are currently well controlled with Allegra alone. Reports typically fall allergies are worse for her. She has not needed nasal steroid or rescue inhaler at all.   Past Medical History:  Diagnosis Date  . Allergic rhinitis   . Asthma    childhood  . External hemorrhoids   . History of menorrhagia   . 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 Nabothian cyst 07/14/2016  . Ovarian cyst    Past Surgical History:  Procedure Laterality Date  . BACK SURGERY    . BREAST SURGERY     reduction  . CESAREAN SECTION    . COLONOSCOPY  2017  . ENDOMETRIAL ABLATION  2006  . REDUCTION MAMMAPLASTY    . TONSILLECTOMY     Social History   Tobacco Use  . Smoking status: Never Smoker  . Smokeless tobacco: Never Used  Substance Use Topics  . Alcohol use:  Yes    Comment: socially   family history includes Alcohol abuse in her mother; Asthma in her father; COPD in her father; Cancer in her father; Depression in her brother and mother; Diabetes in her maternal grandmother; Heart attack in her paternal grandfather; Hyperlipidemia in her father; Hypertension in her father and mother; Prostate cancer in her father; Rheum arthritis in her cousin; Sjogren's syndrome in her mother; Stroke in her maternal grandfather.    ROS: negative except as noted in the HPI  Medications: Current Outpatient Medications  Medication Sig Dispense Refill  . Albuterol Sulfate (PROAIR RESPICLICK) 108 (90 Base) MCG/ACT AEPB Inhale 1-2 puffs into the lungs every 4 (four) hours as needed (wheezing/shortness of breath). 1 each 3  . amphetamine-dextroamphetamine (ADDERALL) 10 MG tablet Take 1 tablet (10 mg total) by mouth 2 (two) times daily. 60 tablet 0  . buPROPion (WELLBUTRIN XL) 150 MG 24 hr tablet Take 1 tablet (150 mg total) by mouth daily. 90 tablet 3  . Calcium Carbonate-Vitamin D 600-400 MG-UNIT tablet Take 1 tablet by mouth 2 (two) times daily. 60 tablet 11  . Cholecalciferol (VITAMIN D3) 2000 units CHEW Chew by mouth daily.    . cyclobenzaprine (FLEXERIL) 10 MG tablet Take 1 tablet (10  mg total) by mouth 3 (three) times daily as needed for muscle spasms. 270 tablet 2  . fexofenadine (ALLEGRA) 180 MG tablet Take 1 tablet (180 mg total) by mouth at bedtime. 90 tablet 3  . ipratropium (ATROVENT) 0.06 % nasal spray Place 2 sprays into both nostrils 4 (four) times daily as needed for rhinitis. 15 mL 0  . losartan (COZAAR) 50 MG tablet Take 1 tablet (50 mg total) by mouth daily. 90 tablet 1  . mometasone (NASONEX) 50 MCG/ACT nasal spray One spray in each nostril twice a day, use left hand for right nostril, and right hand for left nostril.  Please dispense one bottle. 1 g 6  . montelukast (SINGULAIR) 10 MG tablet Take 1 tablet (10 mg total) by mouth at bedtime. 90 tablet 3   . losartan (COZAAR) 100 MG tablet Take 0.5 tablets (50 mg total) by mouth daily. (Patient not taking: Reported on 05/01/2018) 45 tablet 1   No current facility-administered medications for this visit.    Allergies  Allergen Reactions  . Phenergan [Promethazine Hcl]        Objective:  BP 104/62   Pulse 96   Temp (!) 97 F (36.1 C) (Oral)   Wt 124 lb (56.2 kg)   LMP 12/11/2004   BMI 24.22 kg/m  Gen:  alert, not ill-appearing, no distress, appropriate for age HEENT: head normocephalic without obvious abnormality, conjunctiva and cornea clear, trachea midline Pulm: Normal work of breathing, normal phonation, speaking in full sentences Neuro: alert and oriented x 3 Psych: cooperative, euthymic mood, affect mood-congruent, speech is articulate, normal rate and volume; thought processes clear and goal-directed, normal judgment, good insight   Lab Results  Component Value Date   CREATININE 1.03 12/06/2017   BUN 19 12/06/2017   NA 136 12/06/2017   K 3.9 12/06/2017   CL 101 12/06/2017   CO2 25 12/06/2017   Lab Results  Component Value Date   CHOL 205 (H) 12/06/2017   HDL 60 12/06/2017   LDLCALC 125 (H) 12/06/2017   TRIG 94 12/06/2017   CHOLHDL 3.4 12/06/2017    The 09-[id number] ASCVD risk score Denman George DC Jr., et al., 2013) is: 1.4%   Values used to calculate the score:     Age: 53 years     Sex: Female     Is Non-Hispanic African American: No     Diabetic: No     Tobacco smoker: No     Systolic Blood Pressure: 104 mmHg     Is BP treated: Yes     HDL Cholesterol: 60 mg/dL     Total Cholesterol: 205 mg/dL   No results found for this or any previous visit (from the past 72 hour(s)). No results found.    Assessment and Plan: 53 y.o. female with   .Adriana Gordon was seen today for medication management.  Diagnoses and all orders for this visit:  Adult ADHD -     amphetamine-dextroamphetamine (ADDERALL) 10 MG tablet; Take 1 tablet (10 mg total) by mouth 2 (two) times  daily.  Hypertension goal BP (blood pressure) < 130/80 -     losartan (COZAAR) 50 MG tablet; Take 1 tablet (50 mg total) by mouth daily.  Mild intermittent extrinsic asthma without complication   HTN - BP in range. 10 yr ASCVD risk low, 1.4%. Cont Losartan 50 mg. Renal function UTD.   Adult ADHD - cont Wellbutrin and Adderall  Asthma - well controlled  Due in approx 7 months for routine fasting labs.  Overdue for mammogram. These are not currently being scheduled due to COVID-19. Order placed  Follow-up in 6 months or sooner as needed if symptoms worsen or fail to improve  Adriana Hubertharley E. Cummings PA-C    Follow Up Instructions:    I discussed the assessment and treatment plan with the patient. The patient was provided an opportunity to ask questions and all were answered. The patient agreed with the plan and demonstrated an understanding of the instructions.   The patient was advised to call back or seek an in-person evaluation if the symptoms worsen or if the condition fails to improve as anticipated.  I provided 15 minutes of non-face-to-face time during this encounter.   Carlis Stableharley Elizabeth Gordon, [place]PA-C

## 2018-05-02 ENCOUNTER — Ambulatory Visit: Payer: BLUE CROSS/BLUE SHIELD | Admitting: Physician Assistant

## 2018-05-03 ENCOUNTER — Encounter: Payer: Self-pay | Admitting: Physician Assistant

## 2018-05-08 ENCOUNTER — Telehealth: Payer: BLUE CROSS/BLUE SHIELD | Admitting: Physician Assistant

## 2018-05-29 ENCOUNTER — Encounter: Payer: Self-pay | Admitting: Physician Assistant

## 2018-05-30 ENCOUNTER — Ambulatory Visit (INDEPENDENT_AMBULATORY_CARE_PROVIDER_SITE_OTHER): Payer: BC Managed Care – PPO

## 2018-05-30 ENCOUNTER — Other Ambulatory Visit: Payer: Self-pay

## 2018-05-30 ENCOUNTER — Ambulatory Visit (INDEPENDENT_AMBULATORY_CARE_PROVIDER_SITE_OTHER): Payer: BC Managed Care – PPO | Admitting: Sports Medicine

## 2018-05-30 DIAGNOSIS — M47812 Spondylosis without myelopathy or radiculopathy, cervical region: Secondary | ICD-10-CM | POA: Diagnosis not present

## 2018-05-30 DIAGNOSIS — M545 Low back pain: Secondary | ICD-10-CM | POA: Diagnosis not present

## 2018-05-30 DIAGNOSIS — M5030 Other cervical disc degeneration, unspecified cervical region: Secondary | ICD-10-CM

## 2018-05-30 DIAGNOSIS — M4312 Spondylolisthesis, cervical region: Secondary | ICD-10-CM | POA: Diagnosis not present

## 2018-05-30 MED ORDER — TRAMADOL HCL 50 MG PO TABS: 50.0000 mg | ORAL_TABLET | Freq: Three times a day (TID) | ORAL | 0 refills | Status: DC | PRN

## 2018-05-30 MED ORDER — MELOXICAM 15 MG PO TABS: ORAL_TABLET | ORAL | 3 refills | Status: DC
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
## 2018-06-07 ENCOUNTER — Other Ambulatory Visit: Payer: Self-pay

## 2018-06-07 ENCOUNTER — Ambulatory Visit (INDEPENDENT_AMBULATORY_CARE_PROVIDER_SITE_OTHER): Payer: BLUE CROSS/BLUE SHIELD

## 2018-06-07 DIAGNOSIS — Z1231 Encounter for screening mammogram for malignant neoplasm of breast: Secondary | ICD-10-CM | POA: Diagnosis not present

## 2018-06-12 ENCOUNTER — Other Ambulatory Visit: Payer: Self-pay | Admitting: Physician Assistant

## 2018-06-12 DIAGNOSIS — F909 Attention-deficit hyperactivity disorder, unspecified type: Secondary | ICD-10-CM

## 2018-06-13 ENCOUNTER — Encounter: Payer: Self-pay | Admitting: Sports Medicine

## 2018-06-13 ENCOUNTER — Ambulatory Visit (INDEPENDENT_AMBULATORY_CARE_PROVIDER_SITE_OTHER): Payer: BC Managed Care – PPO | Admitting: Sports Medicine

## 2018-06-13 DIAGNOSIS — M5030 Other cervical disc degeneration, unspecified cervical region: Secondary | ICD-10-CM | POA: Diagnosis not present

## 2018-06-13 MED ORDER — AMPHETAMINE-DEXTROAMPHETAMINE 10 MG PO TABS: 10.0000 mg | ORAL_TABLET | Freq: Two times a day (BID) | ORAL | 0 refills | Status: DC

## 2018-06-13 MED ORDER — DEXAMETHASONE 4 MG PO TABS: 4.0000 mg | ORAL_TABLET | Freq: Three times a day (TID) | ORAL | 0 refills | Status: DC

## 2018-06-13 NOTE — Telephone Encounter (Signed)
Last appt with PCP for ADHD was 05/01/18  RX pended, please advise
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
## 2018-06-26 IMAGING — DX DG CERVICAL SPINE COMPLETE 4+V
6 series · 6 of 6 positions shown · non-contrast
Comparison: None.

CLINICAL DATA: Chronic lower neck pain and tenderness. History of
cervical spine surgery in 9053.

EXAM:
CERVICAL SPINE - COMPLETE 4+ VIEW

[c-spine lat]
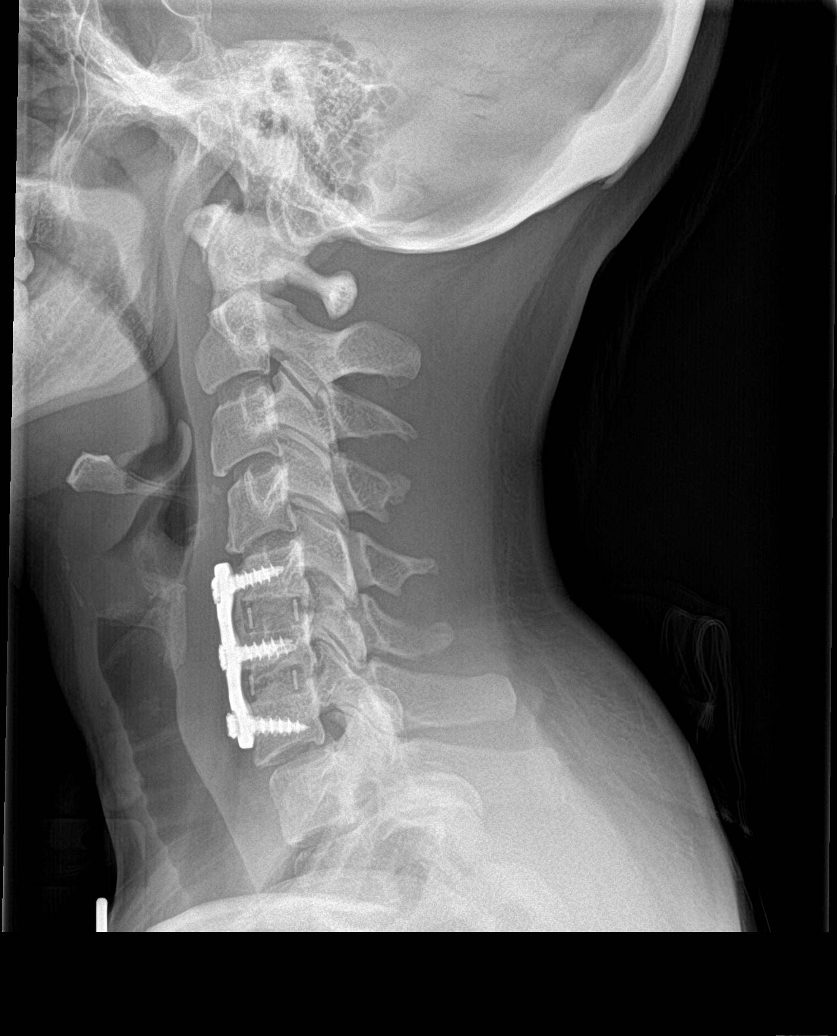

[c-spine obl (1 of 2)]
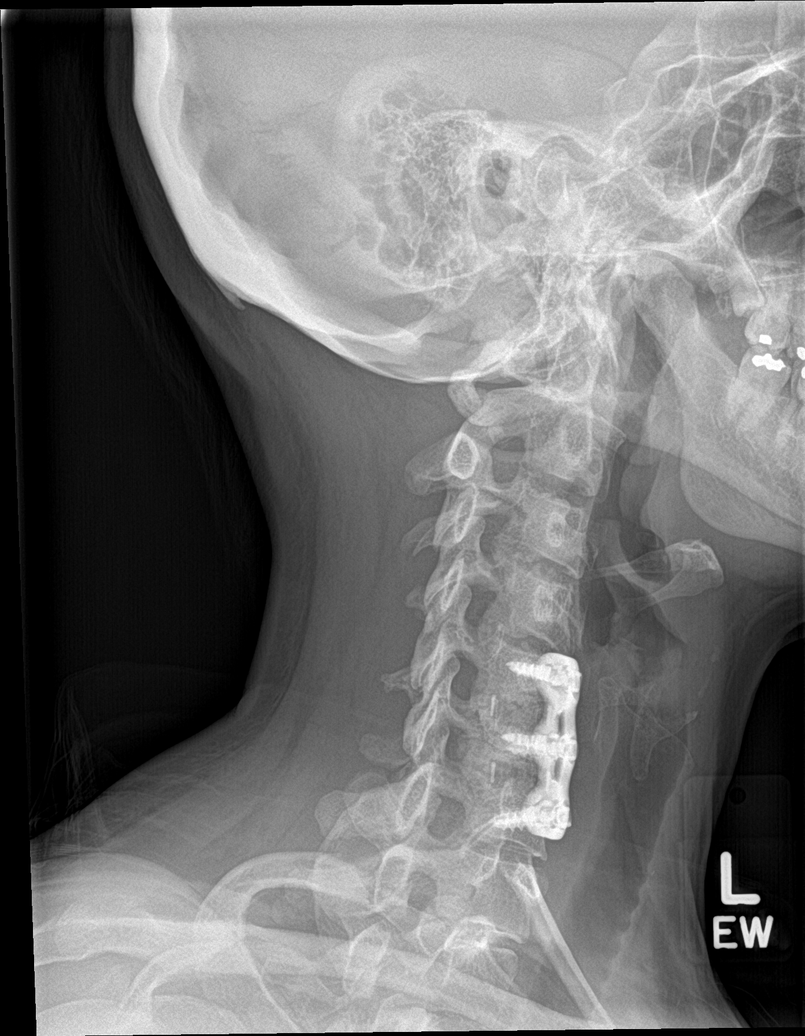

[c-spine obl (2 of 2)]
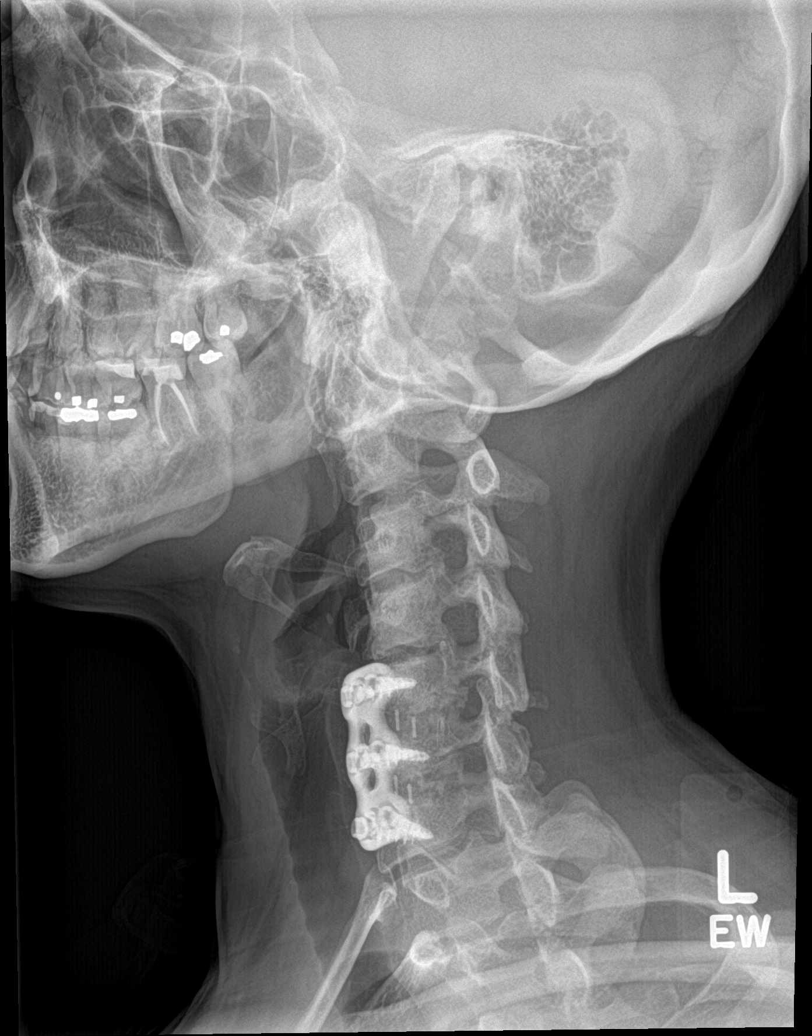

[c-spine ap]
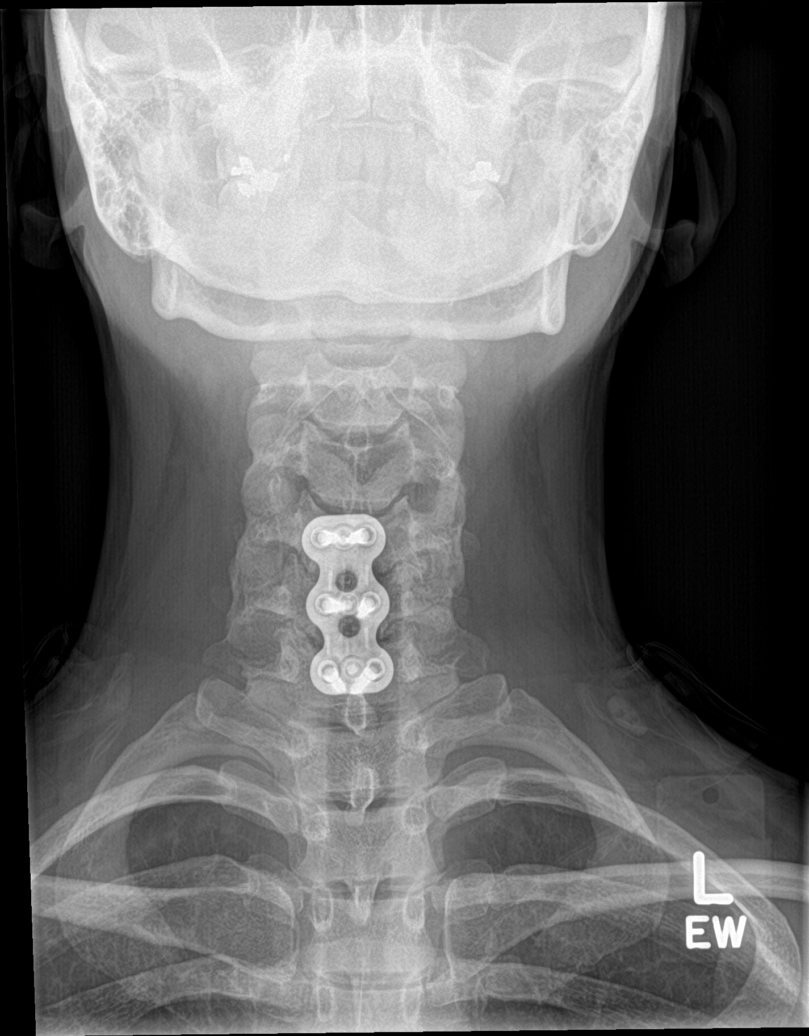

[c-spine open mouth]
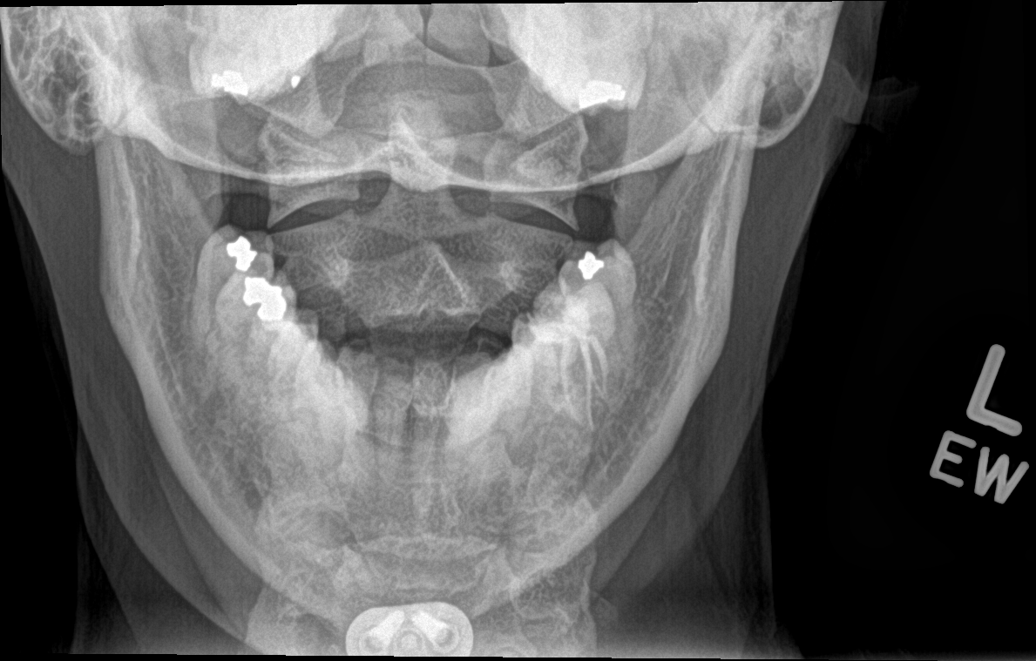

[[person_name]]
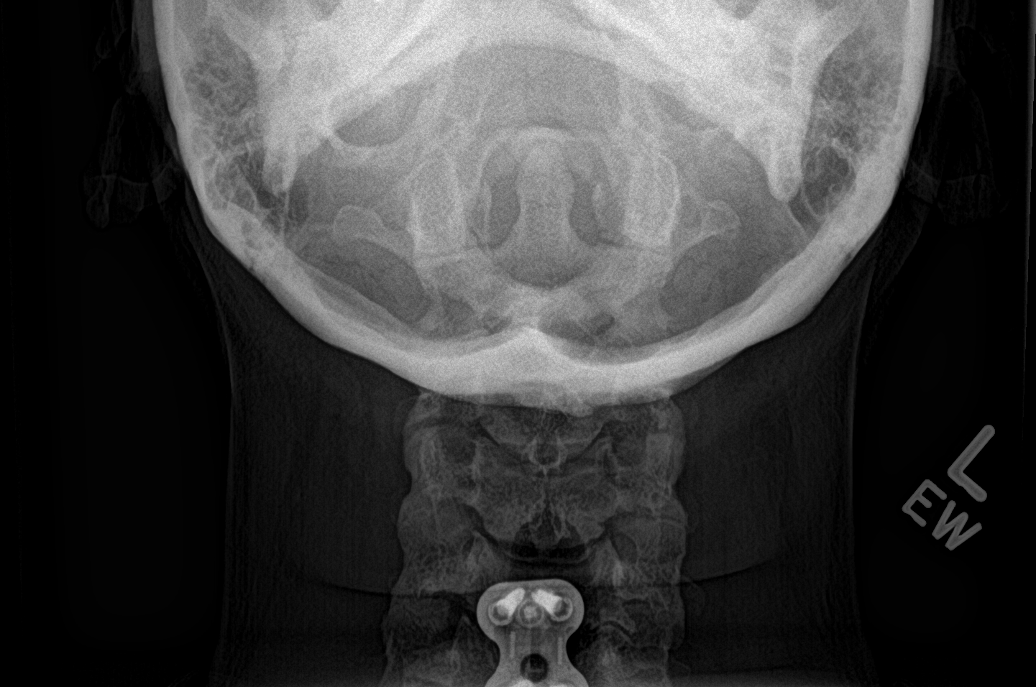

[6 of 6 positions shown; findings below may reference images not displayed]

FINDINGS: There is straightening of the normal cervical lordosis. Vertebral
body height and alignment are maintained. The patient is status post
C5-7 ACDF without evidence of complication. Mild loss of disc space
height is seen at C4-5 and C7-T1. There is some facet degenerative
change at C7-T1. Prevertebral soft tissues appear normal. Lung
apices are clear.
IMPRESSION: No acute abnormality.

Mild appearing spondylosis C4-5 and C7-T1.

Status post C5-7 fusion without evidence complication.

## 2018-06-27 ENCOUNTER — Encounter: Payer: Self-pay | Admitting: Sports Medicine

## 2018-06-27 ENCOUNTER — Ambulatory Visit (INDEPENDENT_AMBULATORY_CARE_PROVIDER_SITE_OTHER): Payer: BC Managed Care – PPO | Admitting: Sports Medicine

## 2018-06-27 DIAGNOSIS — M5030 Other cervical disc degeneration, unspecified cervical region: Secondary | ICD-10-CM
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
## 2018-07-12 ENCOUNTER — Encounter: Payer: Self-pay | Admitting: Physician Assistant

## 2018-07-12 ENCOUNTER — Other Ambulatory Visit: Payer: Self-pay | Admitting: Physician Assistant

## 2018-07-12 DIAGNOSIS — F909 Attention-deficit hyperactivity disorder, unspecified type: Secondary | ICD-10-CM

## 2018-07-12 DIAGNOSIS — F3341 Major depressive disorder, recurrent, in partial remission: Secondary | ICD-10-CM

## 2018-07-13 MED ORDER — BUPROPION HCL ER (XL) 150 MG PO TB24: 150.0000 mg | ORAL_TABLET | Freq: Every day | ORAL | 1 refills | Status: DC

## 2018-07-13 MED ORDER — AMPHETAMINE-DEXTROAMPHETAMINE 10 MG PO TABS: 10.0000 mg | ORAL_TABLET | Freq: Two times a day (BID) | ORAL | 0 refills | Status: DC

## 2018-08-13 ENCOUNTER — Other Ambulatory Visit: Payer: Self-pay | Admitting: Physician Assistant

## 2018-08-13 DIAGNOSIS — F909 Attention-deficit hyperactivity disorder, unspecified type: Secondary | ICD-10-CM

## 2018-08-15 MED ORDER — AMPHETAMINE-DEXTROAMPHETAMINE 10 MG PO TABS: 10.0000 mg | ORAL_TABLET | Freq: Two times a day (BID) | ORAL | 0 refills | Status: DC

## 2018-09-07 DIAGNOSIS — M5412 Radiculopathy, cervical region: Secondary | ICD-10-CM | POA: Diagnosis not present

## 2018-09-16 ENCOUNTER — Other Ambulatory Visit: Payer: Self-pay | Admitting: Physician Assistant

## 2018-09-16 DIAGNOSIS — F909 Attention-deficit hyperactivity disorder, unspecified type: Secondary | ICD-10-CM

## 2018-09-18 MED ORDER — AMPHETAMINE-DEXTROAMPHETAMINE 10 MG PO TABS: 10.0000 mg | ORAL_TABLET | Freq: Two times a day (BID) | ORAL | 0 refills | Status: DC

## 2018-09-19 DIAGNOSIS — M5412 Radiculopathy, cervical region: Secondary | ICD-10-CM | POA: Diagnosis not present

## 2018-09-19 DIAGNOSIS — M5011 Cervical disc disorder with radiculopathy,  high cervical region: Secondary | ICD-10-CM | POA: Diagnosis not present

## 2018-09-19 DIAGNOSIS — M4722 Other spondylosis with radiculopathy, cervical region: Secondary | ICD-10-CM | POA: Diagnosis not present

## 2018-09-19 DIAGNOSIS — M4802 Spinal stenosis, cervical region: Secondary | ICD-10-CM | POA: Diagnosis not present

## 2018-09-19 DIAGNOSIS — M50121 Cervical disc disorder at C4-C5 level with radiculopathy: Secondary | ICD-10-CM | POA: Diagnosis not present

## 2018-09-21 DIAGNOSIS — M5412 Radiculopathy, cervical region: Secondary | ICD-10-CM | POA: Diagnosis not present

## 2018-10-18 ENCOUNTER — Encounter: Payer: Self-pay | Admitting: Sports Medicine

## 2018-10-18 ENCOUNTER — Other Ambulatory Visit: Payer: Self-pay

## 2018-10-18 ENCOUNTER — Ambulatory Visit (INDEPENDENT_AMBULATORY_CARE_PROVIDER_SITE_OTHER): Payer: BC Managed Care – PPO | Admitting: Sports Medicine

## 2018-10-18 DIAGNOSIS — M180 Bilateral primary osteoarthritis of first carpometacarpal joints: Secondary | ICD-10-CM | POA: Diagnosis not present

## 2018-10-18 NOTE — Assessment & Plan Note (Signed)
Bilateral first [facility] injections. Previous injection was in October 2018 on the left. Return as needed.

## 2018-10-18 NOTE — Progress Notes (Signed)
Subjective:    CC: Bilateral hand pain  HPI: Adriana Gordon is a very pleasant 53 year old female, approximately 2 years ago we injected her left first [facility], she did extremely well until now.  Having recurrence of pain but this time bilateral at the base of the thumb, moderate, persistent, localized without radiation.  I reviewed the past medical history, family history, social history, surgical history, and allergies today and no changes were needed.  Please see the problem list section below in epic for further details.  Past Medical History: Past Medical History:  Diagnosis Date  . Allergic rhinitis   . Asthma    childhood  . External hemorrhoids   . History of menorrhagia   . 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 Nabothian cyst 07/14/2016  . Ovarian cyst    Past Surgical History: Past Surgical History:  Procedure Laterality Date  . BACK SURGERY    . BREAST SURGERY     reduction  . CESAREAN SECTION    . COLONOSCOPY  2017  . ENDOMETRIAL ABLATION  2006  . REDUCTION MAMMAPLASTY    . TONSILLECTOMY     Social History: Social History   Socioeconomic History  . Marital status: Married    Spouse name: Not on file  . Number of children: 2  . Years of education: Not on file  . Highest education level: Not on file  Occupational History  . Occupation: unemployed  Social Needs  . Financial resource strain: Not on file  . Food insecurity    Worry: Not on file    Inability: Not on file  . Transportation needs    Medical: Not on file    Non-medical: Not on file  Tobacco Use  . Smoking status: Never Smoker  . Smokeless tobacco: Never Used  Substance and Sexual Activity  . Alcohol use: Yes    Comment: socially  . Drug use: No  . Sexual activity: Yes    Birth control/protection: Post-menopausal  Lifestyle  . Physical activity    Days per week: Not on file    Minutes per session: Not on file  . Stress: Not on file  Relationships  . Social Musician  on phone: Not on file    Gets together: Not on file    Attends religious service: Not on file    Active member of club or organization: Not on file    Attends meetings of clubs or organizations: Not on file    Relationship status: Not on file  Other Topics Concern  . Not on file  Social History Narrative  . Not on file   Family History: Family History  Problem Relation Age of Onset  . Hypertension Mother   . Depression Mother   . Sjogren's syndrome Mother   . Alcohol abuse Mother   . Cancer Father   . Asthma Father   . COPD Father   . Hypertension Father   . Hyperlipidemia Father   . Prostate cancer Father   . Depression Brother   . Diabetes Maternal Grandmother   . Stroke Maternal Grandfather   . Heart attack Paternal Grandfather   . Rheum arthritis Cousin    Allergies: Allergies  Allergen Reactions  . Phenergan [Promethazine Hcl]    Medications: See med rec.  Review of Systems: No fevers, chills, night sweats, weight loss, chest pain, or shortness of breath.   Objective:    General: Well Developed, well nourished, and in no acute distress.  Neuro: Alert and oriented x3, extra-ocular muscles intact, sensation grossly intact.  HEENT: Normocephalic, atraumatic, pupils equal round reactive to light, neck supple, no masses, no lymphadenopathy, thyroid nonpalpable.  Skin: Warm and dry, no rashes. Cardiac: Regular rate and rhythm, no murmurs rubs or gallops, no lower extremity edema.  Respiratory: Clear to auscultation bilaterally. Not using accessory muscles, speaking in full sentences. Hands: Squared off, tender bilateral first [place].  Procedure: Real-time Ultrasound Guided injection of the left first [facility] Device: GE Logiq E  Verbal informed consent obtained.  Time-out conducted.  Noted no overlying erythema, induration, or other signs of local infection.  Skin prepped in a sterile fashion.  Local anesthesia: Topical Ethyl chloride.  With sterile technique and under  real time ultrasound guidance:  1/2 cc Kenalog 40, 1/2 cc lidocaine injected easily Completed without difficulty  Pain immediately resolved suggesting accurate placement of the medication.  Advised to call if fevers/chills, erythema, induration, drainage, or persistent bleeding.  Images permanently stored and available for review in the ultrasound unit.  Impression: Technically successful ultrasound guided injection.  Procedure: Real-time Ultrasound Guided injection of the right first [facility] Device: GE Logiq E  Verbal informed consent obtained.  Time-out conducted.  Noted no overlying erythema, induration, or other signs of local infection.  Skin prepped in a sterile fashion.  Local anesthesia: Topical Ethyl chloride.  With sterile technique and under real time ultrasound guidance:  1/2 cc Kenalog 40, 1/2 cc lidocaine injected easily Completed without difficulty  Pain immediately resolved suggesting accurate placement of the medication.  Advised to call if fevers/chills, erythema, induration, drainage, or persistent bleeding.  Images permanently stored and available for review in the ultrasound unit.  Impression: Technically successful ultrasound guided injection.  Impression and Recommendations:    Primary osteoarthritis of first carpometacarpal joints, bilateral Bilateral first [facility] injections. Previous injection was in October 2018 on the left. Return as needed.   ___________________________________________ Gwen Her. Dianah Field, M.D., ABFM., CAQSM. Primary Care and Sports Medicine [facility]  Adjunct Professor of [employer]

## 2018-10-19 ENCOUNTER — Other Ambulatory Visit: Payer: Self-pay

## 2018-10-19 ENCOUNTER — Ambulatory Visit (INDEPENDENT_AMBULATORY_CARE_PROVIDER_SITE_OTHER): Payer: BC Managed Care – PPO | Admitting: Physician Assistant

## 2018-10-19 ENCOUNTER — Encounter: Payer: Self-pay | Admitting: Physician Assistant

## 2018-10-19 VITALS — BP 128/85 | HR 97 | Wt 115.0 lb

## 2018-10-19 DIAGNOSIS — I10 Essential (primary) hypertension: Secondary | ICD-10-CM | POA: Diagnosis not present

## 2018-10-19 DIAGNOSIS — F909 Attention-deficit hyperactivity disorder, unspecified type: Secondary | ICD-10-CM

## 2018-10-19 MED ORDER — AMPHETAMINE-DEXTROAMPHETAMINE 20 MG PO TABS: 20.0000 mg | ORAL_TABLET | Freq: Two times a day (BID) | ORAL | 0 refills | Status: DC

## 2018-10-19 MED ORDER — LOSARTAN POTASSIUM 50 MG PO TABS: 50.0000 mg | ORAL_TABLET | Freq: Every day | ORAL | 1 refills | Status: DC

## 2018-10-19 NOTE — Progress Notes (Signed)
   Subjective:    Patient ID: Adriana Gordon, female    DOB: 06-06-1965, 53 y.o.   MRN: [id number]  HPI Patient is a 53 y/o female with hx of HTN and ADHD presenting to the clinic to request an increase in adderall dosage.   Patient states that she is currently taking 10mg  twice a day but is still having trouble concentrating. Gave an example of getting up to let the dog out late at night and realizing that she forgot to turn off the oven. States that she had a study done that concluded she metabolizes medication at a higher rate. She denies CP, palpitations, syncope, depression, anxiety, insomnia.   Patient checks her BP in the mornings daily, states that it was about 110/80 this morning. She is currently taking losartan as prescribed.   Review of Systems  Respiratory: Negative for shortness of breath.   Cardiovascular: Negative for chest pain and palpitations.  Neurological: Negative for dizziness, light-headedness and headaches.  Psychiatric/Behavioral: Negative for dysphoric mood and sleep disturbance. The patient is not nervous/anxious.       Objective:   Physical Exam Constitutional:      General: She is not in acute distress.    Appearance: Normal appearance.  HENT:     Head: Normocephalic and atraumatic.  Eyes:     Extraocular Movements: Extraocular movements intact.     Conjunctiva/sclera: Conjunctivae normal.  Cardiovascular:     Rate and Rhythm: Normal rate and regular rhythm.     Pulses: Normal pulses.     Heart sounds: Normal heart sounds.  Pulmonary:     Effort: Pulmonary effort is normal. No respiratory distress.     Breath sounds: Normal breath sounds.  Skin:    General: Skin is warm and dry.  Neurological:     Mental Status: She is alert and oriented to person, place, and time.  Psychiatric:        Mood and Affect: Mood normal.        Behavior: Behavior normal.    .. Today's Vitals   10/19/18 0909  BP: 128/85  Pulse: 97  SpO2: 100%  Weight: 115 lb (52.2  kg)   Body mass index is 22.46 kg/m.  Manual BP of RUE: 118/82       Assessment & Plan:   Marland KitchenMarland KitchenEve was seen today for medication refill.  Diagnoses and all orders for this visit:  Adult ADHD -     amphetamine-dextroamphetamine (ADDERALL) 20 MG tablet; Take 1 tablet (20 mg total) by mouth 2 (two) times daily. -     amphetamine-dextroamphetamine (ADDERALL) 20 MG tablet; Take 1 tablet (20 mg total) by mouth 2 (two) times daily. -     amphetamine-dextroamphetamine (ADDERALL) 20 MG tablet; Take 1 tablet (20 mg total) by mouth 2 (two) times daily.  Hypertension goal BP (blood pressure) < 130/80 -     losartan (COZAAR) 50 MG tablet; Take 1 tablet (50 mg total) by mouth daily.   Pulse and BP were good - increased dose to 20mg  2x/day.  Pt said she may split in half, start with 20mg  in the am and 10mg  in the pm.  Encouraged setting timers and making task lists to help keep on track.  Will continue to monitor for cv, mood, and insomnia sx.   Mood is good - continue wellbutrin.   BP on goal (<130/80). Continue losartan.  Follow up in 3 months.

## 2018-10-19 NOTE — Progress Notes (Deleted)
Look for email.   Mood good.

## 2018-10-19 NOTE — Progress Notes (Signed)
Wants higher dose adderall. States she has a high metabolizing rate for medication.

## 2018-11-02 DIAGNOSIS — M5412 Radiculopathy, cervical region: Secondary | ICD-10-CM | POA: Diagnosis not present

## 2018-11-02 DIAGNOSIS — I10 Essential (primary) hypertension: Secondary | ICD-10-CM | POA: Diagnosis not present

## 2018-11-06 ENCOUNTER — Telehealth: Payer: Self-pay | Admitting: Internal Medicine

## 2018-11-06 NOTE — Telephone Encounter (Signed)
Pt reported that she has a skin tag and would like to be referred to a doctor who can remove it.

## 2018-11-06 NOTE — Telephone Encounter (Signed)
Tried to call pt and the line rang and then went silent

## 2018-11-06 NOTE — Telephone Encounter (Signed)
Dr Hilarie Fredrickson the pt would like a referral to have an anal skin tag removed.  Is it ok to refer to [facility]?

## 2018-11-07 NOTE — Telephone Encounter (Signed)
The pt has been advised that the referral has been made.  She will call if she has not heard from that office in 1 week

## 2018-11-07 NOTE — Telephone Encounter (Signed)
Yes, ok for referral for anal skin tag

## 2018-11-08 DIAGNOSIS — H6983 Other specified disorders of Eustachian tube, bilateral: Secondary | ICD-10-CM | POA: Diagnosis not present

## 2018-11-08 DIAGNOSIS — J309 Allergic rhinitis, unspecified: Secondary | ICD-10-CM | POA: Diagnosis not present

## 2018-11-08 DIAGNOSIS — H93293 Other abnormal auditory perceptions, bilateral: Secondary | ICD-10-CM | POA: Diagnosis not present

## 2018-11-24 DIAGNOSIS — Z1159 Encounter for screening for other viral diseases: Secondary | ICD-10-CM | POA: Diagnosis not present

## 2018-11-30 DIAGNOSIS — M50021 Cervical disc disorder at C4-C5 level with myelopathy: Secondary | ICD-10-CM | POA: Diagnosis not present

## 2018-11-30 DIAGNOSIS — Z981 Arthrodesis status: Secondary | ICD-10-CM | POA: Diagnosis not present

## 2018-11-30 DIAGNOSIS — M50121 Cervical disc disorder at C4-C5 level with radiculopathy: Secondary | ICD-10-CM | POA: Diagnosis not present

## 2018-11-30 DIAGNOSIS — M5412 Radiculopathy, cervical region: Secondary | ICD-10-CM | POA: Diagnosis not present

## 2018-11-30 HISTORY — PX: CERVICAL FUSION: SHX112

## 2018-12-18 ENCOUNTER — Ambulatory Visit: Payer: Self-pay | Admitting: General Surgery

## 2018-12-18 DIAGNOSIS — K642 Third degree hemorrhoids: Secondary | ICD-10-CM | POA: Diagnosis not present

## 2018-12-18 NOTE — H&P (Signed)
History of Present Illness Adriana Ruff MD; 71/06/2692 11:26 AM) The patient is a 53 year old female who presents with a complaint of anal problems. 53 year old female who presents to the office for evaluation of hemorrhoid disease. She has noticed ongoing issues with anal rectal irritation and difficulty with hygiene. She was noted to have a grade 3 hemorrhoid and this was excised via rubber band ligation 2. His is not resolved her symptoms. She reports regular bowel habits with occasional constipation.   Past Surgical History Adriana Gordon, Adriana Gordon; 12/18/2018 11:14 AM) Tonsillectomy  Allergies Adriana Gordon, Adriana Gordon; 12/18/2018 11:15 AM) Phenergan *ANTIHISTAMINES* Allergies Reconciled  Medication History Adriana Gordon; 12/18/2018 11:17 AM) Amphetamine-Dextroamphetamine (20MG  Tablet, Oral) Active. Cyclobenzaprine HCl (10MG  Tablet, Oral) Active. buPROPion HCl ER (XL) (150MG  Tablet ER 24HR, Oral) Active. Losartan Potassium (50MG  Tablet, Oral) Active. Singulair (4MG  Tablet Chewable, Oral) Active. Medications Reconciled  Family History Adriana Gordon, Adriana Gordon; 12/18/2018 11:14 AM) Depression Brother.  Pregnancy / Birth History Adriana Gordon, Adriana Gordon; 12/18/2018 11:14 AM) Age at menarche 82 years.  Other Problems Adriana Gordon; 12/18/2018 11:14 AM) Back Pain Depression     Review of Systems Adriana Gordon Adriana Gordon; 12/18/2018 11:14 AM) General Not Present- Appetite Loss, Chills, Fatigue, Fever, Night Sweats, Weight Gain and Weight Loss. Skin Not Present- Change in Wart/Mole, Dryness, Hives, Jaundice, New Lesions, Non-Healing Wounds, Rash and Ulcer. HEENT Present- Wears glasses/contact lenses. Not Present- Earache, Hearing Loss, Hoarseness, Nose Bleed, Oral Ulcers, Ringing in the Ears, Seasonal Allergies, Sinus Pain, Sore Throat, Visual Disturbances and Yellow Eyes. Respiratory Not Present- Bloody sputum, Chronic Cough, Difficulty Breathing, Snoring and Wheezing. Breast Not  Present- Breast Mass, Breast Pain, Nipple Discharge and Skin Changes. Female Genitourinary Not Present- Frequency, Nocturia, Painful Urination, Pelvic Pain and Urgency. Musculoskeletal Not Present- Back Pain, Joint Pain, Joint Stiffness, Muscle Pain, Muscle Weakness and Swelling of Extremities. Endocrine Present- Hot flashes. Not Present- Cold Intolerance, Excessive Hunger, Hair Changes, Heat Intolerance and New Diabetes.  Vitals Adriana Gordon Adriana Gordon; 12/18/2018 11:18 AM) 12/18/2018 11:17 AM Weight: 121 lb Height: 60in Body Surface Area: 1.51 m Body Mass Index: 23.63 kg/m  Temp.: 98.65F  Pulse: 101 (Regular)  BP: 135/85 (Sitting, Left Arm, Standard)        Physical Exam Adriana Ruff MD; 85/46/2703 11:26 AM)  General Mental Status-Alert. General Appearance-Not in acute distress. Build & Nutrition-Well nourished. Posture-Normal posture. Gait-Normal.  Head and Neck Head-normocephalic, atraumatic with no lesions or palpable masses. Trachea-midline.  Chest and Lung Exam Chest and lung exam reveals -on auscultation, normal breath sounds, no adventitious sounds and normal vocal resonance.  Cardiovascular Cardiovascular examination reveals -normal heart sounds, regular rate and rhythm with no murmurs and no digital clubbing, cyanosis, edema, increased warmth or tenderness.  Abdomen Inspection Inspection of the abdomen reveals - No Hernias. Palpation/Percussion Palpation and Percussion of the abdomen reveal - Soft, Non Tender, No Rigidity (guarding), No hepatosplenomegaly and No Palpable abdominal masses.  Rectal Note: Right anterior hemorrhoid with grade 3 prolapse and associated central skin tag.  Neurologic Neurologic evaluation reveals -alert and oriented x 3 with no impairment of recent or remote memory, normal attention span and ability to concentrate, normal sensation and normal coordination.  Musculoskeletal Normal Exam -  Bilateral-Upper Extremity Strength Normal and Lower Extremity Strength Normal.    Assessment & Plan Adriana Ruff MD; 50/09/3816 11:29 AM)  PROLAPSED INTERNAL HEMORRHOIDS, GRADE 3 (K64.2) Impression: Patient with grade 3 hemorrhoid and associated skin tag. We have discussed single column hemorrhoidectomy as a way to minimize her  symptoms going forward. We discussed that this can cause significant amounts of postoperative pain. We discussed ways to manage his months this as much as possible. We discussed typical recovery, 3-4 weeks. We discussed the risk of recurrence and chronic complications. I believe she understands this and wishes to proceed with surgery.

## 2019-01-09 DIAGNOSIS — Z981 Arthrodesis status: Secondary | ICD-10-CM | POA: Diagnosis not present

## 2019-01-17 ENCOUNTER — Ambulatory Visit: Payer: BC Managed Care – PPO | Admitting: Osteopathic Medicine

## 2019-01-18 ENCOUNTER — Ambulatory Visit (INDEPENDENT_AMBULATORY_CARE_PROVIDER_SITE_OTHER): Payer: BC Managed Care – PPO | Admitting: Physician Assistant

## 2019-01-18 ENCOUNTER — Other Ambulatory Visit: Payer: Self-pay

## 2019-01-18 ENCOUNTER — Encounter: Payer: Self-pay | Admitting: Physician Assistant

## 2019-01-18 ENCOUNTER — Ambulatory Visit: Payer: BC Managed Care – PPO | Admitting: Osteopathic Medicine

## 2019-01-18 VITALS — BP 138/83 | HR 86 | Ht 60.0 in | Wt 121.0 lb

## 2019-01-18 DIAGNOSIS — F909 Attention-deficit hyperactivity disorder, unspecified type: Secondary | ICD-10-CM

## 2019-01-18 DIAGNOSIS — H6993 Unspecified Eustachian tube disorder, bilateral: Secondary | ICD-10-CM

## 2019-01-18 DIAGNOSIS — H938X3 Other specified disorders of ear, bilateral: Secondary | ICD-10-CM | POA: Diagnosis not present

## 2019-01-18 MED ORDER — AMPHETAMINE-DEXTROAMPHETAMINE 20 MG PO TABS: 20.0000 mg | ORAL_TABLET | Freq: Two times a day (BID) | ORAL | 0 refills | Status: DC

## 2019-01-18 NOTE — Progress Notes (Signed)
Subjective:    Patient ID: Adriana Gordon, female    DOB: 1965/11/11, 53 y.o.   MRN: [id number]  HPI  Pt is a 53 yo female with ADHD who presents to the clinic for follow up and medication refill.   Pt is doing well on adderall. No concerns. She is staying focused and no increase in anxiety. No insomnia, CP, palpitations, headaches or dizziness.   She continues to have bilateral ear pressure and what she perceives as hearing loss. She was thought to have ETD but ENT is not convinced. She has tried prednisone, nasonex, atrovent, astelin, allegra, afrin(rotation of 5 days), xyzal. She only gets temporary relief when she pulls her ear and it pops for a few seconds to minutes.  .. Active Ambulatory Problems    Diagnosis Date Noted  . Nabothian cyst 07/14/2016  . Borderline hypercholesterolemia 07/15/2016  . Primary osteoarthritis of first carpometacarpal joints, bilateral 11/19/2016  . Perennial allergic rhinitis 11/19/2016  . DDD (degenerative disc disease), cervical 12/17/2016  . Preventive measure 01/03/2017  . Adult ADHD 11/09/2017  . White coat syndrome with diagnosis of hypertension 11/09/2017  . Hypertension goal BP (blood pressure) < 130/80 01/31/2018  . Extrinsic asthma 01/31/2018  . Eustachian tube disorder, bilateral 01/31/2018   Resolved Ambulatory Problems    Diagnosis Date Noted  . Acute epigastric pain 07/13/2016  . Skin lesion of chest wall 07/15/2016  . Dysuria 11/19/2016  . Elevated blood-pressure reading without diagnosis of hypertension 02/01/2017   Past Medical History:  Diagnosis Date  . Allergic rhinitis   . Asthma   . External hemorrhoids   . History of menorrhagia   . 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 Ovarian cyst      Review of Systems   See HPI.      Objective:   Physical Exam Vitals reviewed.  Constitutional:      Appearance: Normal appearance.  HENT:     Head: Normocephalic.     Right Ear: Tympanic membrane, ear  canal and external ear normal. There is no impacted cerumen.     Left Ear: Tympanic membrane, ear canal and external ear normal. There is no impacted cerumen.     Nose: Nose normal.  Cardiovascular:     Rate and Rhythm: Normal rate and regular rhythm.     Pulses: Normal pulses.  Pulmonary:     Effort: Pulmonary effort is normal.     Breath sounds: Normal breath sounds.  Neurological:     General: No focal deficit present.     Mental Status: She is alert and oriented to person, place, and time.  Psychiatric:        Mood and Affect: Mood normal.        Behavior: Behavior normal.           Assessment & Plan:  Marland KitchenMarland KitchenEve was seen today for adhd.  Diagnoses and all orders for this visit:  Adult ADHD -     amphetamine-dextroamphetamine (ADDERALL) 20 MG tablet; Take 1 tablet (20 mg total) by mouth 2 (two) times daily. -     amphetamine-dextroamphetamine (ADDERALL) 20 MG tablet; Take 1 tablet (20 mg total) by mouth 2 (two) times daily. -     amphetamine-dextroamphetamine (ADDERALL) 20 MG tablet; Take 1 tablet (20 mg total) by mouth 2 (two) times daily.  Eustachian tube disorder, bilateral -     Ambulatory referral to ENT  Ear pressure, bilateral -     Ambulatory referral to ENT  Refilled adderall for 3 months.   Exam appears normal today. Will send to ENT for second opinion of bilateral ear pressure. See note for failed medications.

## 2019-01-29 ENCOUNTER — Ambulatory Visit (INDEPENDENT_AMBULATORY_CARE_PROVIDER_SITE_OTHER): Payer: BC Managed Care – PPO | Admitting: Otolaryngology

## 2019-02-02 ENCOUNTER — Ambulatory Visit (INDEPENDENT_AMBULATORY_CARE_PROVIDER_SITE_OTHER): Payer: BC Managed Care – PPO | Admitting: Otolaryngology

## 2019-02-07 ENCOUNTER — Encounter (INDEPENDENT_AMBULATORY_CARE_PROVIDER_SITE_OTHER): Payer: Self-pay | Admitting: Otolaryngology

## 2019-02-07 ENCOUNTER — Ambulatory Visit (INDEPENDENT_AMBULATORY_CARE_PROVIDER_SITE_OTHER): Payer: BC Managed Care – PPO | Admitting: Otolaryngology

## 2019-02-07 ENCOUNTER — Other Ambulatory Visit: Payer: Self-pay

## 2019-02-07 VITALS — Temp 97.7°F

## 2019-02-07 DIAGNOSIS — H6983 Other specified disorders of Eustachian tube, bilateral: Secondary | ICD-10-CM

## 2019-02-07 HISTORY — PX: MYRINGOTOMY: SUR874

## 2019-02-07 NOTE — Progress Notes (Signed)
HPI: Adriana Gordon is a 54 y.o. female who presents is referred by PCP for evaluation of ear complaints. She complains of chronic sensation of ear fullness that she has had on and off for years. She has been using decongestants as well as nasal steroid spray Flonase as she has been diagnosed with eustachian tube dysfunction. She has not noted any hearing problems but does complain of chronic sensation of fullness in her ears. She states that she is able to "pop" her ears with slight improvement in the symptoms but this only lasts for for a while. She apparently has had this problems for years. She has also had previous disc surgery in her neck initially back in 2012. She has had a previous normal audiogram on November 08, 2018 with bilateral type A tympanograms.  Past Medical History:  Diagnosis Date  . Allergic rhinitis   . Asthma    childhood  . External hemorrhoids   . History of menorrhagia   . 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 Nabothian cyst 07/14/2016  . Ovarian cyst    Past Surgical History:  Procedure Laterality Date  . BACK SURGERY    . BREAST SURGERY     reduction  . CERVICAL FUSION  11/30/2018  . CESAREAN SECTION    . COLONOSCOPY  2017  . ENDOMETRIAL ABLATION  2006  . REDUCTION MAMMAPLASTY    . TONSILLECTOMY     Social History   Socioeconomic History  . Marital status: Married    Spouse name: Not on file  . Number of children: 2  . Years of education: Not on file  . Highest education level: Not on file  Occupational History  . Occupation: unemployed  Tobacco Use  . Smoking status: Never Smoker  . Smokeless tobacco: Never Used  Substance and Sexual Activity  . Alcohol use: Yes    Comment: socially  . Drug use: No  . Sexual activity: Yes    Birth control/protection: Post-menopausal  Other Topics Concern  . Not on file  Social History Narrative  . Not on file   Social Determinants of Health   Financial Resource Strain:   . Difficulty of  Paying Living Expenses: Not on file  Food Insecurity:   . Worried About Charity fundraiser in the Last Year: Not on file  . Ran Out of Food in the Last Year: Not on file  Transportation Needs:   . Lack of Transportation (Medical): Not on file  . Lack of Transportation (Non-Medical): Not on file  Physical Activity:   . Days of Exercise per Week: Not on file  . Minutes of Exercise per Session: Not on file  Stress:   . Feeling of Stress : Not on file  Social Connections:   . Frequency of Communication with Friends and Family: Not on file  . Frequency of Social Gatherings with Friends and Family: Not on file  . Attends Religious Services: Not on file  . Active Member of Clubs or Organizations: Not on file  . Attends Archivist Meetings: Not on file  . Marital Status: Not on file   Family History  Problem Relation Age of Onset  . Hypertension Mother   . Depression Mother   . Sjogren's syndrome Mother   . Alcohol abuse Mother   . Cancer Father   . Asthma Father   . COPD Father   . Hypertension Father   . Hyperlipidemia Father   . Prostate cancer Father   .  Depression Brother   . Diabetes Maternal Grandmother   . Stroke Maternal Grandfather   . Heart attack Paternal Grandfather   . Rheum arthritis Cousin    Allergies  Allergen Reactions  . Phenergan [Promethazine Hcl]    Prior to Admission medications   Medication Sig Start Date End Date Taking? Authorizing Provider  Albuterol Sulfate (PROAIR RESPICLICK) 108 (90 Base) MCG/ACT AEPB Inhale 1-2 puffs into the lungs every 4 (four) hours as needed (wheezing/shortness of breath). 02/03/18   Carlis Stable, PA-C  amphetamine-dextroamphetamine (ADDERALL) 20 MG tablet Take 1 tablet (20 mg total) by mouth 2 (two) times daily. 01/18/19   Breeback, Jade L, PA-C  amphetamine-dextroamphetamine (ADDERALL) 20 MG tablet Take 1 tablet (20 mg total) by mouth 2 (two) times daily. 02/17/19   Breeback, Jade L, PA-C   amphetamine-dextroamphetamine (ADDERALL) 20 MG tablet Take 1 tablet (20 mg total) by mouth 2 (two) times daily. 02/19/19   Breeback, Jade L, PA-C  buPROPion (WELLBUTRIN XL) 150 MG 24 hr tablet Take 1 tablet (150 mg total) by mouth daily. 07/13/18   Carlis Stable, PA-C  Calcium Carbonate-Vitamin D 600-400 MG-UNIT tablet Take 1 tablet by mouth 2 (two) times daily. 07/15/16   Carlis Stable, PA-C  Cholecalciferol (VITAMIN D3) 2000 units CHEW Chew by mouth daily.    [provider]  cyclobenzaprine (FLEXERIL) 10 MG tablet Take 1 tablet (10 mg total) by mouth 3 (three) times daily as needed for muscle spasms. 03/30/17   Monica Becton, MD  fexofenadine (ALLEGRA) 180 MG tablet Take 1 tablet (180 mg total) by mouth at bedtime. 02/03/18   Carlis Stable, PA-C  losartan (COZAAR) 50 MG tablet Take 1 tablet (50 mg total) by mouth daily. 10/19/18   Breeback, Jade L, PA-C  mometasone (NASONEX) 50 MCG/ACT nasal spray One spray in each nostril twice a day, use left hand for right nostril, and right hand for left nostril.  Please dispense one bottle. 02/03/18   Carlis Stable, PA-C     Positive ROS: No dizziness or vertigo  All other systems have been reviewed and were otherwise negative with the exception of those mentioned in the HPI and as above.  Physical Exam: Constitutional: Alert, well-appearing, no acute distress Ears: External ears without lesions or tenderness. Ear canals are clear bilaterally. TMs are clear with good mobility on pneumatic otoscopy on both sides. No middle ear fluid noted. On hearing screening with a 512 and 1024 tuning fork she heard well in both ears with AC > BC bilaterally. Nasal: External nose without lesions. Septum relatively midline.. Clear nasal passages with mild rhinitis. Both middle meatus regions are clear. No signs of infection. Oral: Lips and gums without lesions. Tongue and palate mucosa without lesions.  Posterior oropharynx clear. Neck: No palpable adenopathy or masses Respiratory: Breathing comfortably  Skin: No facial/neck lesions or rash noted.  Myringotomy  Date/Time: 02/07/2019 12:53 PM Performed by: Drema Halon, MD Authorized by: Drema Halon, MD   Consent:    Consent obtained:  Verbal   Consent given by:  Patient   Risks discussed:  Pain   Alternatives discussed:  No treatment Pre-procedure details:    Indications: serous otitis media   Anesthesia:    Anesthesia method:  Topical application   Topical anesthetic:  Phenol Procedure Details:    Location:  Left TM   Hole made in:  Inferior aspect of TM   Tube:  None Post-procedure details:    Patient tolerance of  procedure:  Tolerated well, no immediate complications Comments:     After discussion with patient she wanted to try a myringotomy to see if this helps at all with the symptoms she has had. She tolerated this well.    Assessment: Left ear fullness chronic. Questionable etiology. Essentially normal examination. This may be a neuropathic symptom related to previous neck surgery as ears appear normal clinically. I performed a left myringotomy in the office today to see if this helps at all with the symptoms. She will follow-up in 1 week for recheck.  Plan: She will follow-up in 1 week for recheck if symptoms are improved following the myringotomy consider placement of a myringotomy tube.   Narda Bonds, MD   CC:

## 2019-02-08 ENCOUNTER — Encounter (INDEPENDENT_AMBULATORY_CARE_PROVIDER_SITE_OTHER): Payer: Self-pay

## 2019-02-08 DIAGNOSIS — Z981 Arthrodesis status: Secondary | ICD-10-CM | POA: Diagnosis not present

## 2019-02-09 ENCOUNTER — Other Ambulatory Visit: Payer: Self-pay | Admitting: Physician Assistant

## 2019-02-09 DIAGNOSIS — F3341 Major depressive disorder, recurrent, in partial remission: Secondary | ICD-10-CM

## 2019-02-09 DIAGNOSIS — F909 Attention-deficit hyperactivity disorder, unspecified type: Secondary | ICD-10-CM

## 2019-02-14 ENCOUNTER — Ambulatory Visit (INDEPENDENT_AMBULATORY_CARE_PROVIDER_SITE_OTHER): Payer: BC Managed Care – PPO | Admitting: Otolaryngology

## 2019-02-14 ENCOUNTER — Other Ambulatory Visit: Payer: Self-pay

## 2019-02-14 VITALS — Temp 97.5°F

## 2019-02-14 DIAGNOSIS — H6983 Other specified disorders of Eustachian tube, bilateral: Secondary | ICD-10-CM

## 2019-02-14 NOTE — Progress Notes (Signed)
HPI: Adriana Gordon is a 54 y.o. female who returns today for evaluation of chronic intermittent ear fullness that she has had for years.  I performed a myringotomy in the left ear a week ago to see if this helps at all.  She really did not feel any substantial improvement following the myringotomy.  She has had no drainage from her ear.  She actually feels like the ear feels better when she pulls on the ear inferiorly and posteriorly to straighten the ear canal.  She does not notice that much difficulty hearing but more the sensation of fullness..  Past Medical History:  Diagnosis Date  . Allergic rhinitis   . Asthma    childhood  . External hemorrhoids   . History of menorrhagia   . 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 Nabothian cyst 07/14/2016  . Ovarian cyst    Past Surgical History:  Procedure Laterality Date  . BACK SURGERY    . BREAST SURGERY     reduction  . CERVICAL FUSION  11/30/2018  . CESAREAN SECTION    . COLONOSCOPY  2017  . ENDOMETRIAL ABLATION  2006  . REDUCTION MAMMAPLASTY    . TONSILLECTOMY     Social History   Socioeconomic History  . Marital status: Married    Spouse name: Not on file  . Number of children: 2  . Years of education: Not on file  . Highest education level: Not on file  Occupational History  . Occupation: unemployed  Tobacco Use  . Smoking status: Never Smoker  . Smokeless tobacco: Never Used  Substance and Sexual Activity  . Alcohol use: Yes    Comment: socially  . Drug use: No  . Sexual activity: Yes    Birth control/protection: Post-menopausal  Other Topics Concern  . Not on file  Social History Narrative  . Not on file   Social Determinants of Health   Financial Resource Strain:   . Difficulty of Paying Living Expenses: Not on file  Food Insecurity:   . Worried About Charity fundraiser in the Last Year: Not on file  . Ran Out of Food in the Last Year: Not on file  Transportation Needs:   . Lack of  Transportation (Medical): Not on file  . Lack of Transportation (Non-Medical): Not on file  Physical Activity:   . Days of Exercise per Week: Not on file  . Minutes of Exercise per Session: Not on file  Stress:   . Feeling of Stress : Not on file  Social Connections:   . Frequency of Communication with Friends and Family: Not on file  . Frequency of Social Gatherings with Friends and Family: Not on file  . Attends Religious Services: Not on file  . Active Member of Clubs or Organizations: Not on file  . Attends Archivist Meetings: Not on file  . Marital Status: Not on file   Family History  Problem Relation Age of Onset  . Hypertension Mother   . Depression Mother   . Sjogren's syndrome Mother   . Alcohol abuse Mother   . Cancer Father   . Asthma Father   . COPD Father   . Hypertension Father   . Hyperlipidemia Father   . Prostate cancer Father   . Depression Brother   . Diabetes Maternal Grandmother   . Stroke Maternal Grandfather   . Heart attack Paternal Grandfather   . Rheum arthritis Cousin    Allergies  Allergen  Reactions  . Phenergan [Promethazine Hcl]    Prior to Admission medications   Medication Sig Start Date End Date Taking? Authorizing Provider  Albuterol Sulfate (PROAIR RESPICLICK) 108 (90 Base) MCG/ACT AEPB Inhale 1-2 puffs into the lungs every 4 (four) hours as needed (wheezing/shortness of breath). 02/03/18   Carlis Stable, PA-C  amphetamine-dextroamphetamine (ADDERALL) 20 MG tablet Take 1 tablet (20 mg total) by mouth 2 (two) times daily. 01/18/19   Breeback, Jade L, PA-C  amphetamine-dextroamphetamine (ADDERALL) 20 MG tablet Take 1 tablet (20 mg total) by mouth 2 (two) times daily. 02/17/19   Breeback, Jade L, PA-C  amphetamine-dextroamphetamine (ADDERALL) 20 MG tablet Take 1 tablet (20 mg total) by mouth 2 (two) times daily. 02/19/19   Breeback, Lonna Cobb, PA-C  buPROPion (WELLBUTRIN XL) 150 MG 24 hr tablet TAKE ONE TABLET BY MOUTH  DAILY 02/09/19   Sunnie Nielsen, DO  Calcium Carbonate-Vitamin D 600-400 MG-UNIT tablet Take 1 tablet by mouth 2 (two) times daily. 07/15/16   Carlis Stable, PA-C  Cholecalciferol (VITAMIN D3) 2000 units CHEW Chew by mouth daily.    [provider]  cyclobenzaprine (FLEXERIL) 10 MG tablet Take 1 tablet (10 mg total) by mouth 3 (three) times daily as needed for muscle spasms. 03/30/17   Monica Becton, MD  fexofenadine (ALLEGRA) 180 MG tablet Take 1 tablet (180 mg total) by mouth at bedtime. 02/03/18   Carlis Stable, PA-C  losartan (COZAAR) 50 MG tablet Take 1 tablet (50 mg total) by mouth daily. 10/19/18   Breeback, Jade L, PA-C  mometasone (NASONEX) 50 MCG/ACT nasal spray One spray in each nostril twice a day, use left hand for right nostril, and right hand for left nostril.  Please dispense one bottle. 02/03/18   Carlis Stable, PA-C     Positive ROS: Otherwise negative  All other systems have been reviewed and were otherwise negative with the exception of those mentioned in the HPI and as above.  Physical Exam: Constitutional: Alert, well-appearing, no acute distress Ears: External ears without lesions or tenderness. Ear canals are clear bilaterally.  Right TM is clear and not retracted.  Left TM is clear.  The myringotomy site is healing with a small amount of crust but the TM has good mobility on pneumatic otoscopy.  Hearing screening with a tuning forks reveals good hearing in both ears with the 1024 tuning fork.  AC > BC bilaterally..  Ear canals clear bilaterally. Nasal: External nose without lesions. Clear nasal passages Oral: Lips and gums without lesions. Tongue and palate mucosa without lesions. Posterior oropharynx clear. Neck: No palpable adenopathy or masses Respiratory: Breathing comfortably  Skin: No facial/neck lesions or rash noted.  Procedures  Assessment: Ear fullness questionable etiology.  This apparently feels  better when she pulls her ear canal out and posteriorly.  She may be having some mild hearing loss and mild eustachian tube dysfunction but nothing significant that would improve with placement of a myringotomy tube.  Plan: Did not recommend any intervention at this point outside of use of nasal steroid spray to help with eustachian tube function. She will follow-up as needed.   Narda Bonds, MD

## 2019-02-16 DIAGNOSIS — M5030 Other cervical disc degeneration, unspecified cervical region: Secondary | ICD-10-CM

## 2019-02-16 MED ORDER — CYCLOBENZAPRINE HCL 10 MG PO TABS: 10.0000 mg | ORAL_TABLET | Freq: Three times a day (TID) | ORAL | 2 refills | Status: DC | PRN

## 2019-02-18 NOTE — Addendum Note (Signed)
Addended by: Dillard Cannon E on: 02/18/2019 12:48 PM   Modules accepted: Level of Service

## 2019-02-21 ENCOUNTER — Encounter (HOSPITAL_BASED_OUTPATIENT_CLINIC_OR_DEPARTMENT_OTHER): Payer: Self-pay | Admitting: General Surgery

## 2019-02-21 ENCOUNTER — Other Ambulatory Visit: Payer: Self-pay

## 2019-02-21 NOTE — Progress Notes (Signed)
Spoke w/ via phone for pre-op interview---[facility] needs dos----   ISTAT 8, EKG URINE POCT            Lab results------ COVID test ------02-26-2019 Arrive at -------815 AM 03-01-2019 NPO after -----MDINIGHT- Medications to take morning of surgery -----ALBUTEROL INHALER PRN AND BRING INHALERBUPROPION Diabetic medication -----N/A Patient Special Instructions ----- Pre-Op special Istructions ----- Patient verbalized understanding of instructions that were given at this phone interview. Patient denies shortness of breath, chest pain, fever, cough a this phone interview.

## 2019-02-26 ENCOUNTER — Inpatient Hospital Stay (HOSPITAL_COMMUNITY): Admission: RE | Admit: 2019-02-26 | Payer: BC Managed Care – PPO | Source: Ambulatory Visit

## 2019-03-21 ENCOUNTER — Encounter (HOSPITAL_BASED_OUTPATIENT_CLINIC_OR_DEPARTMENT_OTHER): Payer: Self-pay | Admitting: General Surgery

## 2019-03-21 HISTORY — PX: MOUTH SURGERY: SHX715

## 2019-03-23 ENCOUNTER — Encounter (HOSPITAL_BASED_OUTPATIENT_CLINIC_OR_DEPARTMENT_OTHER): Payer: Self-pay | Admitting: General Surgery

## 2019-03-23 ENCOUNTER — Other Ambulatory Visit: Payer: Self-pay

## 2019-03-23 NOTE — Progress Notes (Signed)
Spoke w/ via phone for pre-op interview---Adriana Gordon needs dos----   I stat 8, ekg urine poct           COVID test ------03-28-2019 Arrive at -------1130 am No food after midnight, clear liquids from midnight until 730 am then npo Medications to take morning of surgery -----bring inhaler, bupropion, penicillian Diabetic medication -----n/a Patient Special Instructions ----- Pre-Op special Istructions ----- Patient verbalized understanding of instructions that were given at this phone interview. Patient denies shortness of breath, chest pain, fever, cough a this phone interview.

## 2019-03-27 ENCOUNTER — Other Ambulatory Visit (HOSPITAL_COMMUNITY): Payer: BC Managed Care – PPO

## 2019-03-28 ENCOUNTER — Other Ambulatory Visit (HOSPITAL_COMMUNITY)
Admission: RE | Admit: 2019-03-28 | Discharge: 2019-03-28 | Disposition: A | Discharge status: Home / Self Care | Payer: BC Managed Care – PPO | Source: Ambulatory Visit | Attending: General Surgery | Admitting: General Surgery

## 2019-03-28 DIAGNOSIS — Z01812 Encounter for preprocedural laboratory examination: Secondary | ICD-10-CM | POA: Insufficient documentation

## 2019-03-28 DIAGNOSIS — Z20822 Contact with and (suspected) exposure to covid-19: Secondary | ICD-10-CM | POA: Diagnosis not present

## 2019-03-28 LAB — SARS CORONAVIRUS 2 (TAT 6-24 HRS): SARS Coronavirus 2: NEGATIVE

## 2019-03-30 ENCOUNTER — Ambulatory Visit (HOSPITAL_BASED_OUTPATIENT_CLINIC_OR_DEPARTMENT_OTHER)
Admission: RE | Admit: 2019-03-30 | Discharge: 2019-03-30 | Disposition: A | Discharge status: Home / Self Care | Payer: BC Managed Care – PPO | Attending: General Surgery | Admitting: General Surgery

## 2019-03-30 ENCOUNTER — Other Ambulatory Visit: Payer: Self-pay

## 2019-03-30 ENCOUNTER — Encounter (HOSPITAL_BASED_OUTPATIENT_CLINIC_OR_DEPARTMENT_OTHER)
Admission: RE | Disposition: A | Discharge status: Home / Self Care | Payer: Self-pay | Source: Home / Self Care | Attending: General Surgery

## 2019-03-30 ENCOUNTER — Encounter (HOSPITAL_BASED_OUTPATIENT_CLINIC_OR_DEPARTMENT_OTHER): Payer: Self-pay | Admitting: General Surgery

## 2019-03-30 ENCOUNTER — Ambulatory Visit (HOSPITAL_BASED_OUTPATIENT_CLINIC_OR_DEPARTMENT_OTHER): Payer: BC Managed Care – PPO | Admitting: Certified Registered"

## 2019-03-30 DIAGNOSIS — F329 Major depressive disorder, single episode, unspecified: Secondary | ICD-10-CM | POA: Diagnosis not present

## 2019-03-30 DIAGNOSIS — K649 Unspecified hemorrhoids: Secondary | ICD-10-CM | POA: Diagnosis not present

## 2019-03-30 DIAGNOSIS — Z79899 Other long term (current) drug therapy: Secondary | ICD-10-CM | POA: Insufficient documentation

## 2019-03-30 DIAGNOSIS — L918 Other hypertrophic disorders of the skin: Secondary | ICD-10-CM | POA: Diagnosis not present

## 2019-03-30 DIAGNOSIS — Z888 Allergy status to other drugs, medicaments and biological substances status: Secondary | ICD-10-CM | POA: Insufficient documentation

## 2019-03-30 DIAGNOSIS — K642 Third degree hemorrhoids: Secondary | ICD-10-CM | POA: Insufficient documentation

## 2019-03-30 DIAGNOSIS — J309 Allergic rhinitis, unspecified: Secondary | ICD-10-CM | POA: Diagnosis not present

## 2019-03-30 DIAGNOSIS — F909 Attention-deficit hyperactivity disorder, unspecified type: Secondary | ICD-10-CM | POA: Diagnosis not present

## 2019-03-30 DIAGNOSIS — I10 Essential (primary) hypertension: Secondary | ICD-10-CM | POA: Diagnosis not present

## 2019-03-30 HISTORY — DX: Unspecified osteoarthritis, unspecified site: M19.90

## 2019-03-30 HISTORY — DX: Unspecified hemorrhoids: K64.9

## 2019-03-30 HISTORY — PX: EVALUATION UNDER ANESTHESIA WITH HEMORRHOIDECTOMY: SHX5624

## 2019-03-30 HISTORY — DX: Attention-deficit hyperactivity disorder, unspecified type: F90.9

## 2019-03-30 HISTORY — DX: Essential (primary) hypertension: I10

## 2019-03-30 HISTORY — DX: Depression, unspecified: F32.A

## 2019-03-30 LAB — POCT I-STAT, CHEM 8
BUN: 18 mg/dL (ref 6–20)
Calcium, Ion: 1.23 mmol/L (ref 1.15–1.40)
Chloride: 102 mmol/L (ref 98–111)
Creatinine, Ser: 0.9 mg/dL (ref 0.44–1.00)
Glucose, Bld: 88 mg/dL (ref 70–99)
HCT: 42 % (ref 36.0–46.0)
Hemoglobin: 14.3 g/dL (ref 12.0–15.0)
Potassium: 5 mmol/L (ref 3.5–5.1)
Sodium: 141 mmol/L (ref 135–145)
TCO2: 31 mmol/L (ref 22–32)

## 2019-03-30 LAB — POCT PREGNANCY, URINE: Preg Test, Ur: NEGATIVE

## 2019-03-30 SURGERY — EXAM UNDER ANESTHESIA WITH HEMORRHOIDECTOMY
Anesthesia: Monitor Anesthesia Care | Site: Rectum

## 2019-03-30 MED ORDER — OXYCODONE HCL 5 MG PO TABS: 5.0000 mg | ORAL_TABLET | ORAL | 0 refills | Status: DC | PRN

## 2019-03-30 MED ORDER — PROPOFOL 500 MG/50ML IV EMUL
INTRAVENOUS | Status: AC
  Filled 2019-03-30: qty 50

## 2019-03-30 MED ORDER — GABAPENTIN 300 MG PO CAPS
ORAL_CAPSULE | ORAL | Status: AC
  Filled 2019-03-30: qty 1

## 2019-03-30 MED ORDER — GABAPENTIN 300 MG PO CAPS
300.0000 mg | ORAL_CAPSULE | ORAL | Status: AC
  Administered 2019-03-30: 300 mg via ORAL
  Filled 2019-03-30: qty 1

## 2019-03-30 MED ORDER — BUPIVACAINE-EPINEPHRINE 0.5% -1:200000 IJ SOLN
INTRAMUSCULAR | Status: DC | PRN
  Administered 2019-03-30: 30 mL

## 2019-03-30 MED ORDER — LIDOCAINE 2% (20 MG/ML) 5 ML SYRINGE
INTRAMUSCULAR | Status: DC | PRN
  Administered 2019-03-30: 40 mg via INTRAVENOUS

## 2019-03-30 MED ORDER — LACTATED RINGERS IV SOLN
INTRAVENOUS | Status: DC
  Filled 2019-03-30: qty 1000

## 2019-03-30 MED ORDER — LIDOCAINE 5 % EX OINT
TOPICAL_OINTMENT | CUTANEOUS | Status: DC | PRN
  Administered 2019-03-30: 1

## 2019-03-30 MED ORDER — PROPOFOL 500 MG/50ML IV EMUL
INTRAVENOUS | Status: DC | PRN
  Administered 2019-03-30: 200 ug/kg/min via INTRAVENOUS

## 2019-03-30 MED ORDER — PROPOFOL 10 MG/ML IV BOLUS
INTRAVENOUS | Status: DC | PRN
  Administered 2019-03-30: 50 mg via INTRAVENOUS

## 2019-03-30 MED ORDER — BUPIVACAINE LIPOSOME 1.3 % IJ SUSP
INTRAMUSCULAR | Status: DC | PRN
  Administered 2019-03-30: 20 mL

## 2019-03-30 MED ORDER — LIDOCAINE 2% (20 MG/ML) 5 ML SYRINGE
INTRAMUSCULAR | Status: AC
  Filled 2019-03-30: qty 5

## 2019-03-30 SURGICAL SUPPLY — 36 items
BLADE EXTENDED COATED 6.5IN (ELECTRODE) IMPLANT
BLADE HEX COATED 2.75 (ELECTRODE) ×2 IMPLANT
BLADE SURG 10 STRL SS (BLADE) IMPLANT
BRIEF STRETCH FOR OB PAD LRG (UNDERPADS AND DIAPERS) IMPLANT
COVER BACK TABLE 60X90IN (DRAPES) ×2 IMPLANT
COVER MAYO STAND STRL (DRAPES) ×2 IMPLANT
COVER WAND RF STERILE (DRAPES) ×4 IMPLANT
DRAPE LAPAROTOMY 100X72 PEDS (DRAPES) ×2 IMPLANT
DRAPE UTILITY XL STRL (DRAPES) ×2 IMPLANT
DRSG PAD ABDOMINAL 8X10 ST (GAUZE/BANDAGES/DRESSINGS) ×2 IMPLANT
ELECT REM PT RETURN 9FT ADLT (ELECTROSURGICAL) ×2
ELECTRODE REM PT RTRN 9FT ADLT (ELECTROSURGICAL) ×1 IMPLANT
GAUZE SPONGE 4X4 12PLY STRL (GAUZE/BANDAGES/DRESSINGS) ×2 IMPLANT
GLOVE BIO SURGEON STRL SZ 6.5 (GLOVE) ×2 IMPLANT
GLOVE BIOGEL PI IND STRL 7.0 (GLOVE) ×1 IMPLANT
GLOVE BIOGEL PI INDICATOR 7.0 (GLOVE) ×1
GOWN STRL REUS W/TWL 2XL LVL3 (GOWN DISPOSABLE) ×2 IMPLANT
KIT SIGMOIDOSCOPE (SET/KITS/TRAYS/PACK) IMPLANT
KIT TURNOVER CYSTO (KITS) ×2 IMPLANT
NEEDLE HYPO 22GX1.5 SAFETY (NEEDLE) ×2 IMPLANT
NS IRRIG 500ML POUR BTL (IV SOLUTION) ×2 IMPLANT
PACK BASIN DAY SURGERY FS (CUSTOM PROCEDURE TRAY) ×2 IMPLANT
PAD ARMBOARD 7.5X6 YLW CONV (MISCELLANEOUS) IMPLANT
PENCIL BUTTON HOLSTER BLD 10FT (ELECTRODE) ×2 IMPLANT
SPONGE SURGIFOAM ABS GEL 100 (HEMOSTASIS) IMPLANT
SPONGE SURGIFOAM ABS GEL 12-7 (HEMOSTASIS) IMPLANT
SUT CHROMIC 2 0 SH (SUTURE) ×2 IMPLANT
SUT CHROMIC 3 0 SH 27 (SUTURE) ×2 IMPLANT
SUT VIC AB 2-0 SH 27 (SUTURE)
SUT VIC AB 2-0 SH 27XBRD (SUTURE) IMPLANT
SUT VIC AB 4-0 SH 18 (SUTURE) IMPLANT
SYR CONTROL 10ML LL (SYRINGE) ×2 IMPLANT
TRAY DSU PREP LF (CUSTOM PROCEDURE TRAY) ×2 IMPLANT
TUBE CONNECTING 12X1/4 (SUCTIONS) ×2 IMPLANT
WATER STERILE IRR 500ML POUR (IV SOLUTION) IMPLANT
YANKAUER SUCT BULB TIP NO VENT (SUCTIONS) ×2 IMPLANT

## 2019-03-30 NOTE — Op Note (Signed)
03/30/2019  12:23 PM  PATIENT:  Adriana Gordon  54 y.o. female  Patient Care Team: Emeterio Reeve, DO as PCP - General (Osteopathic Medicine) Silverio Decamp, MD as Consulting Physician (Sports Medicine)  PRE-OPERATIVE DIAGNOSIS:  GRADE 3 HEMORRHOID  POST-OPERATIVE DIAGNOSIS:  GRADE 3 HEMORRHOID   SINGLE COLUMN HEMORRHOIDECTOMY   Surgeon(s): Leighton Ruff, MD  ASSISTANT: none   ANESTHESIA:   local and MAC  SPECIMEN:  Source of Specimen:  right anterior hemorrhoid  DISPOSITION OF SPECIMEN:  PATHOLOGY  COUNTS:  YES  PLAN OF CARE: Discharge to home after PACU  PATIENT DISPOSITION:  PACU - hemodynamically stable.  INDICATION: 54 y.o. F with prolapsing grade 3 hemrrhoid   OR FINDINGS: Grade 3 R anterior hemorrhoid.  No other pathology noted  DESCRIPTION: the patient was identified in the preoperative holding area and taken to the OR where they were laid on the operating room table.  MAC anesthesia was induced without difficulty. The patient was then positioned in prone jackknife position with buttocks gently taped apart.  The patient was then prepped and draped in usual sterile fashion.  SCDs were noted to be in place prior to the initiation of anesthesia. A surgical timeout was performed indicating the correct patient, procedure, positioning and need for preoperative antibiotics.  A rectal block was performed using Marcaine with epinephrine mixed with Experel.    I began with a digital rectal exam.  There were no masses.  Sphincter tone was good.  I then placed a Hill-Ferguson anoscope into the anal canal and evaluated this completely.  There was a grade 3 right anterior hemorrhoid.  I elevated hemorrhoid with and Allis clamp.  An incision was made around the skin tag using a scalpel.  Dissection was carried down bluntly to the level of the sphincter complex.  The sphincter was separated from the hemorrhoid tissue.  I then divided the anoderm with scissors.  I continued  down until all hemorrhoid tissue was removed.  I then closed the mucosa to the level of the dentate line with a 2-0 running Chromic suture. I closed the remaining anoderm distal to the dentate line with a 3-0 running chromic suture.  Hemostasis was good.  A dressing was applied.  The patient was awakened from anesthesia and sent to the PACU in stable condition.  All counts were correct per OR staff.

## 2019-03-30 NOTE — Discharge Instructions (Addendum)
ANORECTAL SURGERY: POST OP INSTRUCTIONS 1. Take your usually prescribed home medications unless otherwise directed. 2. DIET: During the first few hours after surgery sip on some liquids until you are able to urinate.  It is normal to not urinate for several hours after this surgery.  If you feel uncomfortable, please contact the office for instructions.  After you are able to urinate,you may eat, if you feel like it.  Follow a light bland diet the first 24 hours after arrival home, such as soup, liquids, crackers, etc.  Be sure to include lots of fluids daily (6-8 glasses).  Avoid fast food or heavy meals, as your are more likely to get nauseated.  Eat a low fat diet the next few days after surgery.  Limit caffeine intake to 1-2 servings a day. 3. PAIN CONTROL: a. Pain is best controlled by a usual combination of several different methods TOGETHER: i. Muscle relaxation: Soak in a warm bath (or Sitz bath) three times a day and after bowel movements.  Continue to do this until all pain is resolved. ii. Over the counter pain medication iii. Prescription pain medication b. Most patients will experience some swelling and discomfort in the anus/rectal area and incisions.  Heat such as warm towels, sitz baths, warm baths, etc to help relax tight/sore spots and speed recovery.  Some people prefer to use ice, especially in the first couple days after surgery, as it may decrease the pain and swelling, or alternate between ice & heat.  Experiment to what works for you.  Swelling and bruising can take several weeks to resolve.  Pain can take even longer to completely resolve. c. It is helpful to take an over-the-counter pain medication regularly for the first few weeks.  Choose one of the following that works best for you: i. Naproxen (Aleve, etc)  Two 220mg tabs twice a day ii. Ibuprofen (Advil, etc) Three 200mg tabs four times a day (every meal & bedtime) d. A  prescription for pain medication (such as percocet,  oxycodone, hydrocodone, etc) should be given to you upon discharge.  Take your pain medication as prescribed.  i. If you are having problems/concerns with the prescription medicine (does not control pain, nausea, vomiting, rash, itching, etc), please call us [phone] to see if we need to switch you to a different pain medicine that will work better for you and/or control your side effect better. ii. If you need a refill on your pain medication, please contact your pharmacy.  They will contact our office to request authorization. Prescriptions will not be filled after 5 pm or on week-ends. 4. KEEP YOUR BOWELS REGULAR and AVOID CONSTIPATION a. The goal is one to two soft bowel movements a day.  You should at least have a bowel movement every other day. b. Avoid getting constipated.  Between the surgery and the pain medications, it is common to experience some constipation. This can be very painful after rectal surgery.  Increasing fluid intake and taking a fiber supplement (such as Metamucil, Citrucel, FiberCon, etc) 1-2 times a day regularly will usually help prevent this problem from occurring.  A stool softener like colace is also recommended.  This can be purchased over the counter at your pharmacy.  You can take it up to 3 times a day.  If you do not have a bowel movement after 24 hrs since your surgery, take one does of milk of magnesia.  If you still haven't had a bowel movement 8-12 hours after   that dose, take another dose.  If you don't have a bowel movement 48 hrs after surgery, purchase a Fleets enema from the drug store and administer gently per package instructions.  If you still are having trouble with your bowel movements after that, please call the office for further instructions. c. If you develop diarrhea or have many loose bowel movements, simplify your diet to bland foods & liquids for a few days.  Stop any stool softeners and decrease your fiber supplement.  Switching to mild  anti-diarrheal medications (Kayopectate, Pepto Bismol) can help.  If this worsens or does not improve, please call us.  5. Wound Care a. Remove your bandages before your first bowel movement or 8 hours after surgery.     b. Remove any wound packing material at this tim,e as well.  You do not need to repack the wound unless instructed otherwise.  Wear an absorbent pad or soft cotton gauze in your underwear to catch any drainage and help keep the area clean. You should change this every 2-3 hours while awake. c. Keep the area clean and dry.  Bathe / shower every day, especially after bowel movements.  Keep the area clean by showering / bathing over the incision / wound.   It is okay to soak an open wound to help wash it.  Wet wipes or showers / gentle washing after bowel movements is often less traumatic than regular toilet paper. d. You may have some styrofoam-like soft packing in the rectum which will come out with the first bowel movement.  e. You will often notice bleeding with bowel movements.  This should slow down by the end of the first week of surgery f. Expect some drainage.  This should slow down, too, by the end of the first week of surgery.  Wear an absorbent pad or soft cotton gauze in your underwear until the drainage stops. g. Do Not sit on a rubber or pillow ring.  This can make you symptoms worse.  You may sit on a soft pillow if needed.  6. ACTIVITIES as tolerated:   a. You may resume regular (light) daily activities beginning the next day--such as daily self-care, walking, climbing stairs--gradually increasing activities as tolerated.  If you can walk 30 minutes without difficulty, it is safe to try more intense activity such as jogging, treadmill, bicycling, low-impact aerobics, swimming, etc. b. Save the most intensive and strenuous activity for last such as sit-ups, heavy lifting, contact sports, etc  Refrain from any heavy lifting or straining until you are off narcotics for pain  control.   c. You may drive when you are no longer taking prescription pain medication, you can comfortably sit for long periods of time, and you can safely maneuver your car and apply brakes. d. You may have sexual intercourse when it is comfortable.  7. FOLLOW UP in our office a. Please call [facility] at [phone] to set up an appointment to see your surgeon in the office for a follow-up appointment approximately 3-4 weeks after your surgery. b. Make sure that you call for this appointment the day you arrive home to insure a convenient appointment time. 10. IF YOU HAVE DISABILITY OR FAMILY LEAVE FORMS, BRING THEM TO THE OFFICE FOR PROCESSING.  DO NOT GIVE THEM TO YOUR DOCTOR.     WHEN TO CALL US [phone]: 1. Poor pain control 2. Reactions / problems with new medications (rash/itching, nausea, etc)  3. Fever over 101.5 F (38.5 C) 4.   Inability to urinate 5. Nausea and/or vomiting 6. Worsening swelling or bruising 7. Continued bleeding from incision. 8. Increased pain, redness, or drainage from the incision  The clinic staff is available to answer your questions during regular business hours (8:30am-5pm).  Please don't hesitate to call and ask to speak to one of our nurses for clinical concerns.   A surgeon from [facility] is always on call at the hospitals   If you have a medical emergency, go to the nearest emergency room or call 911.    [facility] [address], [place], [place]  [address] ? MAIN: [phone] ? TOLL FREE: [phone] ? FAX [phone] [email or web address]   Post Anesthesia Home Care Instructions  Activity: Get plenty of rest for the remainder of the day. A responsible individual must stay with you for 24 hours following the procedure.  For the next 24 hours, DO NOT: -Drive a car -Advertising copywriter -Drink alcoholic beverages -Take any medication unless instructed by your physician -Make  any legal decisions or sign important papers.  Meals: Start with liquid foods such as gelatin or soup. Progress to regular foods as tolerated. Avoid greasy, spicy, heavy foods. If nausea and/or vomiting occur, drink only clear liquids until the nausea and/or vomiting subsides. Call your physician if vomiting continues.  Special Instructions/Symptoms: Your throat may feel dry or sore from the anesthesia or the breathing tube placed in your throat during surgery. If this causes discomfort, gargle with warm salt water. The discomfort should disappear within 24 hours.  Information for Discharge Teaching: EXPAREL (bupivacaine liposome injectable suspension)   Your surgeon or anesthesiologist gave you EXPAREL(bupivacaine) to help control your pain after surgery.   EXPAREL is a local anesthetic that provides pain relief by numbing the tissue around the surgical site.  EXPAREL is designed to release pain medication over time and can control pain for up to 72 hours.  Depending on how you respond to EXPAREL, you may require less pain medication during your recovery.  Possible side effects:  Temporary loss of sensation or ability to move in the area where bupivacaine was injected.  Nausea, vomiting, constipation  Rarely, numbness and tingling in your mouth or lips, lightheadedness, or anxiety may occur.  Call your doctor right away if you think you may be experiencing any of these sensations, or if you have other questions regarding possible side effects.  Follow all other discharge instructions given to you by your surgeon or nurse. Eat a healthy diet and drink plenty of water or other fluids.  If you return to the hospital for any reason within 96 hours following the administration of EXPAREL, it is important for health care providers to know that you have received this anesthetic. A teal colored band has been placed on your arm with the date, time and amount of EXPAREL you have received in  order to alert and inform your health care providers. Please leave this armband in place for the full 96 hours following administration, and then you may remove the band.  May remove green armband on Tuesday, April 03, 2019.

## 2019-03-30 NOTE — H&P (Signed)
  The patient is a 54 year old female who presents with a complaint of anal problems. 54 year old female who presents to the office for evaluation of hemorrhoid disease. She has noticed ongoing issues with anal rectal irritation and difficulty with hygiene. She was noted to have a grade 3 hemorrhoid and this was excised via rubber band ligation 2. His is not resolved her symptoms. She reports regular bowel habits with occasional constipation.   Past Surgical History  Tonsillectomy  Allergies  Phenergan *ANTIHISTAMINES* Allergies Reconciled  Medication History  Amphetamine-Dextroamphetamine (20MG  Tablet, Oral) Active. Cyclobenzaprine HCl (10MG  Tablet, Oral) Active. buPROPion HCl ER (XL) (150MG  Tablet ER 24HR, Oral) Active. Losartan Potassium (50MG  Tablet, Oral) Active. Singulair (4MG  Tablet Chewable, Oral) Active. Medications Reconciled  Family History  Depression Brother.  Pregnancy / Birth History  Age at menarche 11 years.  Other Problems Back Pain Depression     Review of Systems  General Not Present- Appetite Loss, Chills, Fatigue, Fever, Night Sweats, Weight Gain and Weight Loss. Skin Not Present- Change in Wart/Mole, Dryness, Hives, Jaundice, New Lesions, Non-Healing Wounds, Rash and Ulcer. HEENT Present- Wears glasses/contact lenses. Not Present- Earache, Hearing Loss, Hoarseness, Nose Bleed, Oral Ulcers, Ringing in the Ears, Seasonal Allergies, Sinus Pain, Sore Throat, Visual Disturbances and Yellow Eyes. Respiratory Not Present- Bloody sputum, Chronic Cough, Difficulty Breathing, Snoring and Wheezing. Breast Not Present- Breast Mass, Breast Pain, Nipple Discharge and Skin Changes. Female Genitourinary Not Present- Frequency, Nocturia, Painful Urination, Pelvic Pain and Urgency. Musculoskeletal Not Present- Back Pain, Joint Pain, Joint Stiffness, Muscle Pain, Muscle Weakness and Swelling of Extremities. Endocrine Present- Hot flashes. Not  Present- Cold Intolerance, Excessive Hunger, Hair Changes, Heat Intolerance and New Diabetes.  BP (!) 164/96   Pulse 94   Temp 98.1 F (36.7 C) (Oral)   Resp 14   Ht 5' (1.524 m)   Wt 53.1 kg   SpO2 100%   BMI 22.85 kg/m   Physical Exam   General Mental Status-Alert. General Appearance-Not in acute distress. Build & Nutrition-Well nourished. Posture-Normal posture. Gait-Normal.  Head and Neck Head-normocephalic, atraumatic with no lesions or palpable masses. Trachea-midline.  Chest and Lung Exam Chest and lung exam reveals -on auscultation, normal breath sounds, no adventitious sounds and normal vocal resonance.  Cardiovascular Cardiovascular examination reveals -normal heart sounds, regular rate and rhythm with no murmurs and no digital clubbing, cyanosis, edema, increased warmth or tenderness.  Abdomen Inspection Inspection of the abdomen reveals - No Hernias. Palpation/Percussion Palpation and Percussion of the abdomen reveal - Soft, Non Tender, No Rigidity (guarding), No hepatosplenomegaly and No Palpable abdominal masses.  Rectal Note: Right anterior hemorrhoid with grade 3 prolapse and associated central skin tag.  Neurologic Neurologic evaluation reveals -alert and oriented x 3 with no impairment of recent or remote memory, normal attention span and ability to concentrate, normal sensation and normal coordination.  Musculoskeletal Normal Exam - Bilateral-Upper Extremity Strength Normal and Lower Extremity Strength Normal.    Assessment & Plan   PROLAPSED INTERNAL HEMORRHOIDS, GRADE 3 (K64.2) Impression: Patient with grade 3 hemorrhoid and associated skin tag. We have discussed single column hemorrhoidectomy as a way to minimize her symptoms going forward. We discussed that this can cause significant amounts of postoperative pain. We discussed ways to manage his months this as much as possible. We discussed typical recovery,  3-4 weeks. We discussed the risk of recurrence and chronic complications. I believe she understands this and wishes to proceed with surgery.

## 2019-03-30 NOTE — Anesthesia Preprocedure Evaluation (Addendum)
Anesthesia Evaluation  Patient identified by MRN, date of birth, ID band Patient awake    Reviewed: Allergy & Precautions, NPO status , Patient's Chart, lab work & pertinent test results  History of Anesthesia Complications Negative for: history of anesthetic complications  Airway Mallampati: II  TM Distance: >3 FB Neck ROM: Full    Dental  (+) Teeth Intact   Pulmonary neg pulmonary ROS,    Pulmonary exam normal        Cardiovascular hypertension, negative cardio ROS Normal cardiovascular exam     Neuro/Psych PSYCHIATRIC DISORDERS Depression negative neurological ROS     GI/Hepatic negative GI ROS, Neg liver ROS,   Endo/Other  negative endocrine ROS  Renal/GU negative Renal ROS  negative genitourinary   Musculoskeletal negative musculoskeletal ROS (+)   Abdominal   Peds  (+) ATTENTION DEFICIT DISORDER WITHOUT HYPERACTIVITY Hematology negative hematology ROS (+)   Anesthesia Other Findings   Reproductive/Obstetrics                            Anesthesia Physical Anesthesia Plan  ASA: II  Anesthesia Plan: MAC   Post-op Pain Management:    Induction: Intravenous  PONV Risk Score and Plan: 2 and Propofol infusion, TIVA and Treatment may vary due to age or medical condition  Airway Management Planned: Natural Airway, Nasal Cannula and Simple Face Mask  Additional Equipment: None  Intra-op Plan:   Post-operative Plan:   Informed Consent: I have reviewed the patients History and Physical, chart, labs and discussed the procedure including the risks, benefits and alternatives for the proposed anesthesia with the patient or authorized representative who has indicated his/her understanding and acceptance.       Plan Discussed with:   Anesthesia Plan Comments:         Anesthesia Quick Evaluation

## 2019-03-30 NOTE — Transfer of Care (Signed)
Immediate Anesthesia Transfer of Care Note  Patient: Adriana Gordon  Procedure(s) Performed: Procedure(s) (LRB): SINGLE COLUMN HEMORRHOIDECTOMY (N/A)  Patient Location: PACU  Anesthesia Type: MAC  Level of Consciousness: awake, alert , oriented and patient cooperative  Airway & Oxygen Therapy: Patient Spontanous Breathing and Patient connected to face mask oxygen  Post-op Assessment: Report given to PACU RN and Post -op Vital signs reviewed and stable  Post vital signs: Reviewed and stable  Complications: No apparent anesthesia complications Last Vitals:  Vitals Value Taken Time  BP 122/78 03/30/19 1300  Temp 36.4 C 03/30/19 1254  Pulse 91 03/30/19 1305  Resp 19 03/30/19 1305  SpO2 100 % 03/30/19 1305  Vitals shown include unvalidated device data.  Last Pain:  Vitals:   03/30/19 1254  TempSrc:   PainSc: 0-No pain      Patients Stated Pain Goal: 4 (03/30/19 1132)

## 2019-04-02 LAB — SURGICAL PATHOLOGY

## 2019-04-02 NOTE — Anesthesia Postprocedure Evaluation (Addendum)
Anesthesia Post Note  Patient: Adriana Gordon  Procedure(s) Performed: SINGLE COLUMN HEMORRHOIDECTOMY (N/A Rectum)     Patient location during evaluation: PACU Anesthesia Type: MAC Level of consciousness: awake and alert Pain management: pain level controlled Vital Signs Assessment: post-procedure vital signs reviewed and stable Respiratory status: spontaneous breathing, nonlabored ventilation and respiratory function stable Cardiovascular status: stable and blood pressure returned to baseline Anesthetic complications: no    Last Vitals:  Vitals:   03/30/19 1315 03/30/19 1330  BP: 139/87 (!) 159/92  Pulse: 84 81  Resp: 14 10  Temp:    SpO2: 100% 100%    Last Pain:  Vitals:   04/02/19 0937  TempSrc:   PainSc: [place] Rebecah Dangerfield

## 2019-04-10 ENCOUNTER — Encounter: Payer: Self-pay | Admitting: Osteopathic Medicine

## 2019-04-10 ENCOUNTER — Other Ambulatory Visit: Payer: Self-pay

## 2019-04-10 ENCOUNTER — Ambulatory Visit (INDEPENDENT_AMBULATORY_CARE_PROVIDER_SITE_OTHER): Payer: BC Managed Care – PPO | Admitting: Osteopathic Medicine

## 2019-04-10 VITALS — BP 128/76 | HR 84 | Temp 98.5°F | Wt 124.0 lb

## 2019-04-10 DIAGNOSIS — Z0000 Encounter for general adult medical examination without abnormal findings: Secondary | ICD-10-CM | POA: Diagnosis not present

## 2019-04-10 DIAGNOSIS — F3341 Major depressive disorder, recurrent, in partial remission: Secondary | ICD-10-CM

## 2019-04-10 DIAGNOSIS — F909 Attention-deficit hyperactivity disorder, unspecified type: Secondary | ICD-10-CM

## 2019-04-10 DIAGNOSIS — M5030 Other cervical disc degeneration, unspecified cervical region: Secondary | ICD-10-CM

## 2019-04-10 LAB — LIPID PANEL
Cholesterol: 195 mg/dL (ref <200)
HDL: 52 mg/dL (ref >=50)
LDL Cholesterol (Calc): 117 mg/dL (calc) — ABNORMAL HIGH
Non-HDL Cholesterol (Calc): 143 mg/dL (calc) — ABNORMAL HIGH (ref <130)
Total CHOL/HDL Ratio: 3.8 (calc) (ref <5.0)
Triglycerides: 148 mg/dL (ref <150)

## 2019-04-10 LAB — CBC
HCT: 41.8 % (ref 35.0–45.0)
Hemoglobin: 13.8 g/dL (ref 11.7–15.5)
MCH: 30.3 pg (ref 27.0–33.0)
MCHC: 33 g/dL (ref 32.0–36.0)
MCV: 91.7 fL (ref 80.0–100.0)
MPV: 10.4 fL (ref 7.5–12.5)
Platelets: 375 10*3/uL (ref 140–400)
RBC: 4.56 10*6/uL (ref 3.80–5.10)
RDW: 13.4 % (ref 11.0–15.0)
WBC: 6.2 10*3/uL (ref 3.8–10.8)

## 2019-04-10 LAB — COMPLETE METABOLIC PANEL WITH GFR
AG Ratio: 1.7 (calc) (ref 1.0–2.5)
ALT: 23 U/L (ref 6–29)
AST: 14 U/L (ref 10–35)
Albumin: 4.3 g/dL (ref 3.6–5.1)
Alkaline phosphatase (APISO): 83 U/L (ref 37–153)
BUN: 18 mg/dL (ref 7–25)
CO2: 29 mmol/L (ref 20–32)
Calcium: 9.5 mg/dL (ref 8.6–10.4)
Chloride: 105 mmol/L (ref 98–110)
Creat: 0.9 mg/dL (ref 0.50–1.05)
GFR, Est African American: 84 mL/min/{1.73_m2} (ref >=60)
GFR, Est Non African American: 72 mL/min/{1.73_m2} (ref >=60)
Globulin: 2.6 g/dL (calc) (ref 1.9–3.7)
Glucose, Bld: 93 mg/dL (ref 65–99)
Potassium: 4.6 mmol/L (ref 3.5–5.3)
Sodium: 140 mmol/L (ref 135–146)
Total Bilirubin: 0.4 mg/dL (ref 0.2–1.2)
Total Protein: 6.9 g/dL (ref 6.1–8.1)

## 2019-04-10 MED ORDER — AMPHETAMINE-DEXTROAMPHETAMINE 20 MG PO TABS: 20.0000 mg | ORAL_TABLET | Freq: Two times a day (BID) | ORAL | 0 refills | Status: DC

## 2019-04-10 MED ORDER — BUPROPION HCL ER (XL) 150 MG PO TB24: 150.0000 mg | ORAL_TABLET | Freq: Every day | ORAL | 3 refills | Status: DC

## 2019-04-10 NOTE — Progress Notes (Signed)
Adriana Gordon is a 54 y.o. female who presents to  [facility]  today, 04/10/19, seeking care for the following: . Establish care  . Refill meds      ASSESSMENT & PLAN with other pertinent history/findings:  The primary encounter diagnosis was Attention deficit hyperactivity disorder (ADHD), unspecified ADHD type. Diagnoses of DDD (degenerative disc disease), cervical and Annual physical exam were also pertinent to this visit.  Labs ordered for future visit. Annual physical / preventive care was NOT performed or billed today.     Orders Placed This Encounter  Procedures  . CBC  . COMPLETE METABOLIC PANEL WITH GFR  . Lipid panel    Meds ordered this encounter  Medications  . amphetamine-dextroamphetamine (ADDERALL) 20 MG tablet    Sig: Take 1 tablet (20 mg total) by mouth 2 (two) times daily.    Dispense:  180 tablet    Refill:  0    If #180 for 90 days not permitted, please use separate Rx 1) Disp #60 to fill 05/03/19 Refill x0; 2) Disp #60 to fill in 30 days from Rx date Refill x0; 3) Disp #60 to fill in 60 days from Rx date Refill x0  . buPROPion (WELLBUTRIN XL) 150 MG 24 hr tablet    Sig: Take 1 tablet (150 mg total) by mouth daily.    Dispense:  90 tablet    Refill:  3       Follow-up instructions: Return in about 3 months (around 07/11/2019) for bring BP monitor with you! Annual w/ med refills. .                                         BP 128/76 (BP Location: Left Arm, Patient Position: Sitting, Cuff Size: Normal)   Pulse 84   Temp 98.5 F (36.9 C) (Oral)   Wt 124 lb 0.6 oz (56.3 kg)   BMI 24.22 kg/m   Current Meds  Medication Sig  . Albuterol Sulfate (PROAIR RESPICLICK) 941 (90 Base) MCG/ACT AEPB Inhale 1-2 puffs into the lungs every 4 (four) hours as needed (wheezing/shortness of breath).  Marland Kitchen amphetamine-dextroamphetamine (ADDERALL) 20 MG tablet Take 1 tablet (20 mg  total) by mouth 2 (two) times daily.  Marland Kitchen buPROPion (WELLBUTRIN XL) 150 MG 24 hr tablet TAKE ONE TABLET BY MOUTH DAILY  . Calcium Carbonate-Vitamin D 600-400 MG-UNIT tablet Take 1 tablet by mouth 2 (two) times daily.  . Cholecalciferol (VITAMIN D3) 2000 units CHEW Chew by mouth daily.  . cyclobenzaprine (FLEXERIL) 10 MG tablet Take 1 tablet (10 mg total) by mouth 3 (three) times daily as needed for muscle spasms.  Marland Kitchen losartan (COZAAR) 50 MG tablet Take 1 tablet (50 mg total) by mouth daily.    No results found for this or any previous visit (from the past 72 hour(s)).  No results found.  Depression screen [facility] 2/9 01/18/2019 05/03/2018 11/09/2017  Decreased Interest 0 0 0  Down, Depressed, Hopeless 0 0 0  PHQ - 2 Score 0 0 0  Altered sleeping 0 - 1  Tired, decreased energy 0 - 0  Change in appetite 0 - 0  Feeling bad or failure about yourself  0 - 0  Trouble concentrating 0 - -  Moving slowly or fidgety/restless 0 - -  Suicidal thoughts 0 - -  PHQ-9 Score 0 - 1  Difficult  doing work/chores Not difficult at all - -    GAD 7 : Generalized Anxiety Score 01/18/2019  Nervous, Anxious, on Edge 0  Control/stop worrying 0  Worry too much - different things 0  Trouble relaxing 0  Restless 0  Easily annoyed or irritable 0  Afraid - awful might happen 0  Total GAD 7 Score 0  Anxiety Difficulty Not difficult at all      All questions at time of visit were answered - patient instructed to contact office with any additional concerns or updates.  ER/RTC precautions were reviewed with the patient.  Please note: voice recognition software was used to produce this document, and typos may escape review. Please contact Dr. Lyn Hollingshead for any needed clarifications.

## 2019-04-24 DIAGNOSIS — M4310 Spondylolisthesis, site unspecified: Secondary | ICD-10-CM | POA: Diagnosis not present

## 2019-04-27 ENCOUNTER — Other Ambulatory Visit: Payer: Self-pay

## 2019-04-27 ENCOUNTER — Ambulatory Visit (INDEPENDENT_AMBULATORY_CARE_PROVIDER_SITE_OTHER): Payer: BC Managed Care – PPO | Admitting: Sports Medicine

## 2019-04-27 DIAGNOSIS — M5030 Other cervical disc degeneration, unspecified cervical region: Secondary | ICD-10-CM

## 2019-04-27 DIAGNOSIS — M180 Bilateral primary osteoarthritis of first carpometacarpal joints: Secondary | ICD-10-CM

## 2019-04-27 NOTE — Assessment & Plan Note (Signed)
Today we performed a first [facility] injection on the right. Previous injection was in September 2020.

## 2019-04-27 NOTE — Assessment & Plan Note (Signed)
Adriana Gordon's MRI showed a C4-C5 herniated disc with uncomplicated ACDF from C5-C7, she recently had her ACDF extended to C4-C7.

## 2019-04-27 NOTE — Progress Notes (Signed)
    Procedures performed today:    Procedure: Real-time Ultrasound Guided injection of the right first [facility] Device: Samsung [id number]  Verbal informed consent obtained.  Time-out conducted.  Noted no overlying erythema, induration, or other signs of local infection.  Skin prepped in a sterile fashion.  Local anesthesia: Topical Ethyl chloride.  With sterile technique and under real time ultrasound guidance:  1/2 cc lidocaine, 1/2 cc Kenalog 40 injected easily completed without difficulty  Pain immediately resolved suggesting accurate placement of the medication.  Advised to call if fevers/chills, erythema, induration, drainage, or persistent bleeding.  Images permanently stored and available for review in the ultrasound unit.  Impression: Technically successful ultrasound guided injection.  Independent interpretation of notes and tests performed by another provider:   MRI from [facility] on September 19, 2018 shows a uncomplicated ACDF from C5-C7 and a large disc protrusion at C4-C5 with indentation of the thecal sac.  Brief History, Exam, Impression, and Recommendations:    Primary osteoarthritis of first carpometacarpal joints, bilateral Today we performed a first [facility] injection on the right. Previous injection was in September 2020.  DDD (degenerative disc disease), cervical Adriana Gordon's MRI showed a C4-C5 herniated disc with uncomplicated ACDF from C5-C7, she recently had her ACDF extended to C4-C7.     ___________________________________________ Adriana Gordon. Adriana Gordon, M.D., ABFM., CAQSM. Primary Care and Sports Medicine [facility]  Adjunct Instructor of Family Medicine  [employer]

## 2019-05-01 DIAGNOSIS — M4310 Spondylolisthesis, site unspecified: Secondary | ICD-10-CM | POA: Diagnosis not present

## 2019-05-01 DIAGNOSIS — M5127 Other intervertebral disc displacement, lumbosacral region: Secondary | ICD-10-CM | POA: Diagnosis not present

## 2019-05-02 DIAGNOSIS — M4310 Spondylolisthesis, site unspecified: Secondary | ICD-10-CM | POA: Diagnosis not present

## 2019-05-02 DIAGNOSIS — M7138 Other bursal cyst, other site: Secondary | ICD-10-CM | POA: Diagnosis not present

## 2019-05-07 DIAGNOSIS — M25775 Osteophyte, left foot: Secondary | ICD-10-CM | POA: Diagnosis not present

## 2019-05-07 DIAGNOSIS — L600 Ingrowing nail: Secondary | ICD-10-CM | POA: Diagnosis not present

## 2019-05-07 DIAGNOSIS — M79671 Pain in right foot: Secondary | ICD-10-CM | POA: Diagnosis not present

## 2019-05-07 DIAGNOSIS — M25774 Osteophyte, right foot: Secondary | ICD-10-CM | POA: Diagnosis not present

## 2019-05-07 DIAGNOSIS — M79672 Pain in left foot: Secondary | ICD-10-CM | POA: Diagnosis not present

## 2019-05-07 DIAGNOSIS — B351 Tinea unguium: Secondary | ICD-10-CM | POA: Diagnosis not present

## 2019-05-11 DIAGNOSIS — M5416 Radiculopathy, lumbar region: Secondary | ICD-10-CM | POA: Diagnosis not present

## 2019-05-11 DIAGNOSIS — M461 Sacroiliitis, not elsewhere classified: Secondary | ICD-10-CM | POA: Diagnosis not present

## 2019-05-11 DIAGNOSIS — M47816 Spondylosis without myelopathy or radiculopathy, lumbar region: Secondary | ICD-10-CM | POA: Diagnosis not present

## 2019-05-11 DIAGNOSIS — M4310 Spondylolisthesis, site unspecified: Secondary | ICD-10-CM | POA: Diagnosis not present

## 2019-05-15 DIAGNOSIS — M4310 Spondylolisthesis, site unspecified: Secondary | ICD-10-CM | POA: Diagnosis not present

## 2019-05-24 DIAGNOSIS — M79642 Pain in left hand: Secondary | ICD-10-CM | POA: Diagnosis not present

## 2019-05-24 DIAGNOSIS — M79641 Pain in right hand: Secondary | ICD-10-CM | POA: Diagnosis not present

## 2019-05-24 DIAGNOSIS — M4310 Spondylolisthesis, site unspecified: Secondary | ICD-10-CM | POA: Diagnosis not present

## 2019-05-30 DIAGNOSIS — M5416 Radiculopathy, lumbar region: Secondary | ICD-10-CM | POA: Diagnosis not present

## 2019-06-05 DIAGNOSIS — M79642 Pain in left hand: Secondary | ICD-10-CM | POA: Diagnosis not present

## 2019-06-05 DIAGNOSIS — M79641 Pain in right hand: Secondary | ICD-10-CM | POA: Diagnosis not present

## 2019-06-05 DIAGNOSIS — M4310 Spondylolisthesis, site unspecified: Secondary | ICD-10-CM | POA: Diagnosis not present

## 2019-06-14 DIAGNOSIS — M79642 Pain in left hand: Secondary | ICD-10-CM | POA: Diagnosis not present

## 2019-06-14 DIAGNOSIS — M4310 Spondylolisthesis, site unspecified: Secondary | ICD-10-CM | POA: Diagnosis not present

## 2019-06-14 DIAGNOSIS — M79641 Pain in right hand: Secondary | ICD-10-CM | POA: Diagnosis not present

## 2019-07-04 ENCOUNTER — Other Ambulatory Visit: Payer: Self-pay

## 2019-07-04 NOTE — Telephone Encounter (Signed)
Patient called into office needing a refill on Lamisil  the provider who normally prescribed this medication is out on leave due to health issues and patient doesn't know when he will return. Patient would like to know if she can here to get blood work done and if we can refill this medication for her.  Please advise

## 2019-07-09 ENCOUNTER — Telehealth: Payer: Self-pay

## 2019-07-09 NOTE — Telephone Encounter (Signed)
[facility] notified to contact original prescribing provider (Dr. Merwyn Katos [phone]). No other inquiries during call.

## 2019-07-09 NOTE — Telephone Encounter (Signed)
Looks like it was sent 05/2019 - pharmacy should contact original prescriber

## 2019-07-09 NOTE — Telephone Encounter (Signed)
[facility] requesting med refill for terbinafine. Rx written by historical provider.

## 2019-07-12 ENCOUNTER — Encounter: Payer: Self-pay | Admitting: Osteopathic Medicine

## 2019-07-12 ENCOUNTER — Other Ambulatory Visit: Payer: Self-pay

## 2019-07-12 ENCOUNTER — Ambulatory Visit (INDEPENDENT_AMBULATORY_CARE_PROVIDER_SITE_OTHER): Payer: BC Managed Care – PPO | Admitting: Osteopathic Medicine

## 2019-07-12 VITALS — BP 138/97 | HR 98 | Temp 98.1°F | Wt 128.1 lb

## 2019-07-12 DIAGNOSIS — Z0000 Encounter for general adult medical examination without abnormal findings: Secondary | ICD-10-CM

## 2019-07-12 DIAGNOSIS — F909 Attention-deficit hyperactivity disorder, unspecified type: Secondary | ICD-10-CM

## 2019-07-12 DIAGNOSIS — B351 Tinea unguium: Secondary | ICD-10-CM | POA: Diagnosis not present

## 2019-07-12 DIAGNOSIS — Z1231 Encounter for screening mammogram for malignant neoplasm of breast: Secondary | ICD-10-CM

## 2019-07-12 MED ORDER — TERBINAFINE HCL 250 MG PO TABS: 250.0000 mg | ORAL_TABLET | Freq: Every day | ORAL | 0 refills | Status: DC

## 2019-07-12 MED ORDER — AMPHETAMINE-DEXTROAMPHETAMINE 20 MG PO TABS: 20.0000 mg | ORAL_TABLET | Freq: Two times a day (BID) | ORAL | 0 refills | Status: DC

## 2019-07-12 NOTE — Progress Notes (Signed)
Adriana Gordon is a 54 y.o. female who presents to  [facility] at [facility]  today, 07/12/19, seeking care for the following:  . Annual - see below for preventive care review . BP check . ADD Rx refill  . Onychomycosis - refill terbinafine, podiatrist had to reschedule / will be out for few months      ASSESSMENT & PLAN with other pertinent findings:  The primary encounter diagnosis was Annual physical exam. Diagnoses of Adult ADHD, Breast cancer screening by mammogram, and Onychomycosis were also pertinent to this visit.   No results found for this or any previous visit (from the past 24 hour(s)).     Patient Instructions  General Preventive Care  Most recent routine screening labs: 03/2019 all ok.   Blood pressure goal 130/80 or less.   Tobacco: don't!   Alcohol: responsible moderation is ok for most adults - if you have concerns about your alcohol intake, please talk to me!   Exercise: as tolerated to reduce risk of cardiovascular disease and diabetes. Strength training will also prevent osteoporosis.   Mental health: if need for mental health care (medicines, counseling, other), or concerns about moods, please let me know!   Sexual / Reproductive health: if need for STD testing, or if concerns with libido/pain problems, please let me know!  Advanced Directive: Living Will and/or Healthcare Power of Attorney recommended for all adults, regardless of age or health.  Vaccines  Flu vaccine: for almost everyone, every fall.   Shingles vaccine: after age 55.   Pneumonia vaccines: after age 62  Tetanus booster: every 10 years   COVID vaccine: STRONGLY RECOMMENDED  Cancer screenings   Colon cancer screening: for everyone age 26-75. Due 01/2025.  Breast cancer screening: mammogram annually after age 39 - ordered.  Cervical cancer screening: Pap due 07/2021  Lung cancer screening: not needed for non-smokers  Infection  screenings  . HIV: recommended screening at least once age 78-65, more often as needed. . Gonorrhea/Chlamydia: screening as needed . Hepatitis C: recommended once for everyone age 20-75 . TB: certain at-risk populations, or depending on work requirements and/or travel history Other . Bone Density Test: recommended for women at age 37   Orders Placed This Encounter  Procedures  . MM 3D SCREEN BREAST BILATERAL  . Hepatic function panel    Meds ordered this encounter  Medications  . amphetamine-dextroamphetamine (ADDERALL) 20 MG tablet    Sig: Take 1 tablet (20 mg total) by mouth 2 (two) times daily.    Dispense:  180 tablet    Refill:  0    If #180 for 90 days not permitted, please use separate Rx 1) Disp #60 to fill 05/03/19 Refill x0; 2) Disp #60 to fill in 30 days from Rx date Refill x0; 3) Disp #60 to fill in 60 days from Rx date Refill x0  . terbinafine (LAMISIL) 250 MG tablet    Sig: Take 1 tablet (250 mg total) by mouth daily.    Dispense:  180 tablet    Refill:  0       Follow-up instructions: Return in about 6 months (around 01/11/2020) for BP check and ADD Rx refills - see [place] sooner if needed! .                                         BP Marland Kitchen)  138/97 (BP Location: Left Arm, Patient Position: Sitting, Cuff Size: Normal)   Pulse 98   Temp 98.1 F (36.7 C) (Oral)   Wt 128 lb 1.3 oz (58.1 kg)   BMI 25.01 kg/m   Current Meds  Medication Sig  . Albuterol Sulfate (PROAIR RESPICLICK) 962 (90 Base) MCG/ACT AEPB Inhale 1-2 puffs into the lungs every 4 (four) hours as needed (wheezing/shortness of breath).  Marland Kitchen amphetamine-dextroamphetamine (ADDERALL) 20 MG tablet Take 1 tablet (20 mg total) by mouth 2 (two) times daily.  Marland Kitchen buPROPion (WELLBUTRIN XL) 150 MG 24 hr tablet Take 1 tablet (150 mg total) by mouth daily.  . Calcium Carbonate-Vitamin D 600-400 MG-UNIT tablet Take 1 tablet by mouth 2 (two) times daily.  . Cholecalciferol (VITAMIN  D3) 2000 units CHEW Chew by mouth daily.  . cyclobenzaprine (FLEXERIL) 10 MG tablet Take 1 tablet (10 mg total) by mouth 3 (three) times daily as needed for muscle spasms.  Marland Kitchen terbinafine (LAMISIL) 250 MG tablet Take 1 tablet (250 mg total) by mouth daily.  . [DISCONTINUED] amphetamine-dextroamphetamine (ADDERALL) 20 MG tablet Take 1 tablet (20 mg total) by mouth 2 (two) times daily.  . [DISCONTINUED] terbinafine (LAMISIL) 250 MG tablet Take 250 mg by mouth daily.    No results found for this or any previous visit (from the past 72 hour(s)).  No results found.     All questions at time of visit were answered - patient instructed to contact office with any additional concerns or updates.  ER/RTC precautions were reviewed with the patient as applicable.   Please note: voice recognition software was used to produce this document, and typos may escape review. Please contact Dr. Sheppard Coil for any needed clarifications.

## 2019-07-12 NOTE — Patient Instructions (Signed)
General Preventive Care  Most recent routine screening labs: 03/2019 all ok.   Blood pressure goal 130/80 or less.   Tobacco: don't!   Alcohol: responsible moderation is ok for most adults - if you have concerns about your alcohol intake, please talk to me!   Exercise: as tolerated to reduce risk of cardiovascular disease and diabetes. Strength training will also prevent osteoporosis.   Mental health: if need for mental health care (medicines, counseling, other), or concerns about moods, please let me know!   Sexual / Reproductive health: if need for STD testing, or if concerns with libido/pain problems, please let me know!  Advanced Directive: Living Will and/or Healthcare Power of Attorney recommended for all adults, regardless of age or health.  Vaccines  Flu vaccine: for almost everyone, every fall.   Shingles vaccine: after age 49.   Pneumonia vaccines: after age 3  Tetanus booster: every 10 years   COVID vaccine: STRONGLY RECOMMENDED  Cancer screenings   Colon cancer screening: for everyone age 37-75. Due 01/2025.  Breast cancer screening: mammogram annually after age 56 - ordered.  Cervical cancer screening: Pap due 07/2021  Lung cancer screening: not needed for non-smokers  Infection screenings  . HIV: recommended screening at least once age 51-65, more often as needed. . Gonorrhea/Chlamydia: screening as needed . Hepatitis C: recommended once for everyone age 67-75 . TB: certain at-risk populations, or depending on work requirements and/or travel history Other . Bone Density Test: recommended for women at age 56

## 2019-07-19 ENCOUNTER — Ambulatory Visit (INDEPENDENT_AMBULATORY_CARE_PROVIDER_SITE_OTHER): Payer: BC Managed Care – PPO

## 2019-07-19 ENCOUNTER — Other Ambulatory Visit: Payer: Self-pay

## 2019-07-19 DIAGNOSIS — Z1231 Encounter for screening mammogram for malignant neoplasm of breast: Secondary | ICD-10-CM | POA: Diagnosis not present

## 2019-07-19 DIAGNOSIS — Z0000 Encounter for general adult medical examination without abnormal findings: Secondary | ICD-10-CM | POA: Diagnosis not present

## 2019-08-17 LAB — HEPATIC FUNCTION PANEL
AG Ratio: 1.7 (calc) (ref 1.0–2.5)
ALT: 13 U/L (ref 6–29)
AST: 12 U/L (ref 10–35)
Albumin: 4.3 g/dL (ref 3.6–5.1)
Alkaline phosphatase (APISO): 62 U/L (ref 37–153)
Bilirubin, Direct: 0.1 mg/dL (ref 0.0–0.2)
Globulin: 2.6 g/dL (calc) (ref 1.9–3.7)
Indirect Bilirubin: 0.2 mg/dL (calc) (ref 0.2–1.2)
Total Bilirubin: 0.3 mg/dL (ref 0.2–1.2)
Total Protein: 6.9 g/dL (ref 6.1–8.1)

## 2019-11-21 DIAGNOSIS — Z6825 Body mass index (BMI) 25.0-25.9, adult: Secondary | ICD-10-CM | POA: Diagnosis not present

## 2019-11-21 DIAGNOSIS — I10 Essential (primary) hypertension: Secondary | ICD-10-CM | POA: Diagnosis not present

## 2019-11-26 DIAGNOSIS — L600 Ingrowing nail: Secondary | ICD-10-CM | POA: Diagnosis not present

## 2019-11-26 DIAGNOSIS — L603 Nail dystrophy: Secondary | ICD-10-CM | POA: Diagnosis not present

## 2019-11-26 DIAGNOSIS — M25572 Pain in left ankle and joints of left foot: Secondary | ICD-10-CM | POA: Diagnosis not present

## 2019-11-26 DIAGNOSIS — M25571 Pain in right ankle and joints of right foot: Secondary | ICD-10-CM | POA: Diagnosis not present

## 2019-12-01 ENCOUNTER — Encounter: Payer: Self-pay | Admitting: Emergency Medicine

## 2019-12-01 ENCOUNTER — Other Ambulatory Visit: Payer: Self-pay

## 2019-12-01 ENCOUNTER — Emergency Department: Admit: 2019-12-01 | Discharge status: Absent without leave | Payer: Self-pay

## 2019-12-01 ENCOUNTER — Emergency Department (INDEPENDENT_AMBULATORY_CARE_PROVIDER_SITE_OTHER)
Admission: EM | Admit: 2019-12-01 | Discharge: 2019-12-01 | Disposition: A | Discharge status: Home / Self Care | Payer: BC Managed Care – PPO | Source: Home / Self Care

## 2019-12-01 DIAGNOSIS — K047 Periapical abscess without sinus: Secondary | ICD-10-CM | POA: Diagnosis not present

## 2019-12-01 MED ORDER — CLINDAMYCIN HCL 300 MG PO CAPS
300.0000 mg | ORAL_CAPSULE | Freq: Three times a day (TID) | ORAL | 0 refills | Status: AC
Start: 2019-12-01 — End: 2019-12-08

## 2019-12-01 MED ORDER — IBUPROFEN 600 MG PO TABS
600.0000 mg | ORAL_TABLET | Freq: Once | ORAL | Status: AC
  Administered 2019-12-01: 600 mg via ORAL

## 2019-12-01 NOTE — ED Provider Notes (Signed)
[facility]    CSN: [id number] Arrival date & time: 12/01/19  0818      History   Chief Complaint Chief Complaint  Patient presents with  . Oral Swelling    HPI Adriana Gordon is a 54 y.o. female.   HPI  Adriana Gordon is a 54 y.o. 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 She noticed some drainage and a small piece of bone work its way out. This morning she noticed jaw swelling and more soreness. Similar symptoms about 2 weeks ago, tx with penicillin and doxycycline by her dentist.  She plans to call her dentist on Monday.  Denies fever, chills, n/v/d.    Past Medical History:  Diagnosis Date  . ADHD   . Allergic rhinitis   . Asthma    childhood  . Depression   . External hemorrhoids   . Hemorrhoids   . History of menorrhagia   . Hypertension    GETS WHITE COAT SYNDROME AT TIMES  . 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 Nabothian cyst 07/14/2016  . OA (osteoarthritis)   . Ovarian cyst     Patient Active Problem List   Diagnosis Date Noted  . Extrinsic asthma 01/31/2018  . Eustachian tube disorder, bilateral 01/31/2018  . Adult ADHD 11/09/2017  . Elevated blood-pressure reading without diagnosis of hypertension 02/01/2017  . DDD (degenerative disc disease), cervical 12/17/2016  . Primary osteoarthritis of first carpometacarpal joints, bilateral 11/19/2016  . Perennial allergic rhinitis 11/19/2016  . Borderline hypercholesterolemia 07/15/2016  . Nabothian cyst 07/14/2016    Past Surgical History:  Procedure Laterality Date  . BACK SURGERY    . BREAST SURGERY     reduction  . CERVICAL FUSION  11/30/2018   C 5 TO C 7, C 4 TO C 5 SECOND SX  . CESAREAN SECTION    . COLONOSCOPY  2017  . ENDOMETRIAL ABLATION  2006  . EVALUATION UNDER ANESTHESIA WITH HEMORRHOIDECTOMY N/A 03/30/2019   Procedure: SINGLE COLUMN HEMORRHOIDECTOMY;  Surgeon: Romie Levee, MD;  Location: [facility];  Service: General;  Laterality: N/A;  . MOUTH SURGERY  03/21/2019  . MYRINGOTOMY Left 02/07/2019  . REDUCTION MAMMAPLASTY    . TONSILLECTOMY     AND ADENOIDS  . UPPER GI ENDOSCOPY      OB History    Gravida  2   Para  2   Term  2   Preterm      AB      Living  2     SAB      TAB      Ectopic      Multiple      Live Births               Home Medications    Prior to Admission medications   Medication Sig Start Date End Date Taking? Authorizing Provider  Albuterol Sulfate (PROAIR RESPICLICK) 108 (90 Base) MCG/ACT AEPB Inhale 1-2 puffs into the lungs every 4 (four) hours as needed (wheezing/shortness of breath). 02/03/18   Carlis Stable, PA-C  amphetamine-dextroamphetamine (ADDERALL) 20 MG tablet Take 1 tablet (20 mg total) by mouth 2 (two) times daily. 07/12/19 10/10/19  Sunnie Nielsen, DO  buPROPion (WELLBUTRIN XL) 150 MG 24 hr tablet Take 1 tablet (150 mg total) by mouth daily. 04/10/19   Sunnie Nielsen, DO  Calcium Carbonate-Vitamin  D 600-400 MG-UNIT tablet Take 1 tablet by mouth 2 (two) times daily. 07/15/16   Carlis Stable, PA-C  Cholecalciferol (VITAMIN D3) 2000 units CHEW Chew by mouth daily.    [provider]  clindamycin (CLEOCIN) 300 MG capsule Take 1 capsule (300 mg total) by mouth 3 (three) times daily for 7 days. 12/01/19 12/08/19  Lurene Shadow, PA-C  cyclobenzaprine (FLEXERIL) 10 MG tablet Take 1 tablet (10 mg total) by mouth 3 (three) times daily as needed for muscle spasms. 02/16/19   Monica Becton, MD  terbinafine (LAMISIL) 250 MG tablet Take 1 tablet (250 mg total) by mouth daily. 07/12/19   Sunnie Nielsen, DO    Family History Family History  Problem Relation Age of Onset  . Hypertension Mother   . Depression Mother   . Sjogren's syndrome Mother   . Alcohol abuse Mother   . Cancer Father   . Asthma Father   . COPD Father   .  Hypertension Father   . Hyperlipidemia Father   . Prostate cancer Father   . Depression Brother   . Diabetes Maternal Grandmother   . Stroke Maternal Grandfather   . Heart attack Paternal Grandfather   . Rheum arthritis Cousin     Social History Social History   Tobacco Use  . Smoking status: Never Smoker  . Smokeless tobacco: Never Used  Vaping Use  . Vaping Use: Never used  Substance Use Topics  . Alcohol use: Yes    Comment: socially  . Drug use: No     Allergies   Phenergan [promethazine hcl] and Warfarin and related   Review of Systems Review of Systems  Constitutional: Negative for chills and fever.  HENT: Positive for dental problem and facial swelling. Negative for congestion.   Gastrointestinal: Negative for nausea and vomiting.  Neurological: Negative for dizziness and headaches.     Physical Exam Triage Vital Signs ED Triage Vitals  Enc Vitals Group     BP 12/01/19 0835 (!) 162/95     Pulse Rate 12/01/19 0835 93     Resp 12/01/19 0835 16     Temp 12/01/19 0835 98.5 F (36.9 C)     Temp Source 12/01/19 0835 Oral     SpO2 12/01/19 0835 99 %     Weight 12/01/19 0837 127 lb (57.6 kg)     Height 12/01/19 0837 5' (1.524 m)     Head Circumference --      Peak Flow --      Pain Score 12/01/19 0837 3     Pain Loc --      Pain Edu? --      Excl. in GC? --    No data found.  Updated Vital Signs BP (!) 162/95 (BP Location: Right Arm) Comment: reports white coat syndrome  Pulse 93   Temp 98.5 F (36.9 C) (Oral)   Resp 16   Ht 5' (1.524 m)   Wt 127 lb (57.6 kg)   SpO2 99%   BMI 24.80 kg/m   Visual Acuity Right Eye Distance:   Left Eye Distance:   Bilateral Distance:    Right Eye Near:   Left Eye Near:    Bilateral Near:     Physical Exam Vitals and nursing note reviewed.  Constitutional:      General: She is not in acute distress.    Appearance: Normal appearance. She is well-developed. She is not ill-appearing, toxic-appearing or  diaphoretic.  HENT:  Head: Normocephalic and atraumatic.     Jaw: There is normal jaw occlusion. Tenderness and swelling present. No trismus, pain on movement or malocclusion.      Right Ear: Tympanic membrane and ear canal normal.     Left Ear: Tympanic membrane and ear canal normal.     Nose: Nose normal.     Right Sinus: No maxillary sinus tenderness or frontal sinus tenderness.     Left Sinus: No maxillary sinus tenderness or frontal sinus tenderness.     Mouth/Throat:     Lips: Pink.     Mouth: Mucous membranes are moist.     Pharynx: Oropharynx is clear. Uvula midline.   Cardiovascular:     Rate and Rhythm: Normal rate.  Pulmonary:     Effort: Pulmonary effort is normal.  Musculoskeletal:        General: Normal range of motion.     Cervical back: Normal range of motion.  Skin:    General: Skin is warm and dry.  Neurological:     Mental Status: She is alert and oriented to person, place, and time.  Psychiatric:        Behavior: Behavior normal.      UC Treatments / Results  Labs (all labs ordered are listed, but only abnormal results are displayed) Labs Reviewed - No data to display  EKG   Radiology No results found.  Procedures Procedures (including critical care time)  Medications Ordered in UC Medications  ibuprofen (ADVIL) tablet 600 mg (600 mg Oral Given 12/01/19 0842)    Initial Impression / Assessment and Plan / UC Course  I have reviewed the triage vital signs and the nursing notes.  Pertinent labs & imaging results that were available during my care of the patient were reviewed by me and considered in my medical decision making (see chart for details).     Hx and exam c/w dental abscess Due to recent tx with penicillin and doxycycline, will tx with clindamycin today F/u with dentist next week AVS given  Final Clinical Impressions(s) / UC Diagnoses   Final diagnoses:  Dental abscess     Discharge Instructions      Please take  antibiotics as prescribed and be sure to complete entire course even if you start to feel better to ensure infection does not come back.  Call Monday to schedule a follow up appointment with your dentist later this week. Let them know you have been started on clindamycin today.     ED Prescriptions    Medication Sig Dispense Auth. Provider   clindamycin (CLEOCIN) 300 MG capsule Take 1 capsule (300 mg total) by mouth 3 (three) times daily for 7 days. 21 capsule Lurene Shadow, [place]     PDMP not reviewed this encounter.   Lurene Shadow, [place] 12/01/19 [phone]

## 2019-12-01 NOTE — Discharge Instructions (Signed)
°  Please take antibiotics as prescribed and be sure to complete entire course even if you start to feel better to ensure infection does not come back.  Call Monday to schedule a follow up appointment with your dentist later this week. Let them know you have been started on clindamycin today.

## 2019-12-01 NOTE — ED Triage Notes (Signed)
Patient here for dental pain and edema along left lower jaw; had bone grafts in 3/21 and since has had 2 episodes of pieces of bone working their way out leading to infectious process; knows she will need to see her DDS Monday. Has had covid vaccinations.

## 2019-12-03 DIAGNOSIS — Z01419 Encounter for gynecological examination (general) (routine) without abnormal findings: Secondary | ICD-10-CM | POA: Diagnosis not present

## 2019-12-03 DIAGNOSIS — Z1151 Encounter for screening for human papillomavirus (HPV): Secondary | ICD-10-CM | POA: Diagnosis not present

## 2019-12-03 DIAGNOSIS — Z6826 Body mass index (BMI) 26.0-26.9, adult: Secondary | ICD-10-CM | POA: Diagnosis not present

## 2019-12-04 ENCOUNTER — Telehealth: Payer: Self-pay | Admitting: *Deleted

## 2019-12-04 NOTE — Telephone Encounter (Signed)
Left patient a message to call the office in reference to authorization form from [facility].

## 2019-12-24 DIAGNOSIS — M21612 Bunion of left foot: Secondary | ICD-10-CM | POA: Diagnosis not present

## 2019-12-24 DIAGNOSIS — Z01812 Encounter for preprocedural laboratory examination: Secondary | ICD-10-CM | POA: Diagnosis not present

## 2019-12-24 DIAGNOSIS — M25572 Pain in left ankle and joints of left foot: Secondary | ICD-10-CM | POA: Diagnosis not present

## 2019-12-24 DIAGNOSIS — Z79899 Other long term (current) drug therapy: Secondary | ICD-10-CM | POA: Diagnosis not present

## 2019-12-24 DIAGNOSIS — M79672 Pain in left foot: Secondary | ICD-10-CM | POA: Diagnosis not present

## 2020-01-08 DIAGNOSIS — M21612 Bunion of left foot: Secondary | ICD-10-CM | POA: Diagnosis not present

## 2020-01-08 DIAGNOSIS — M2012 Hallux valgus (acquired), left foot: Secondary | ICD-10-CM | POA: Diagnosis not present

## 2020-01-08 DIAGNOSIS — M25572 Pain in left ankle and joints of left foot: Secondary | ICD-10-CM | POA: Diagnosis not present

## 2020-01-10 ENCOUNTER — Telehealth (INDEPENDENT_AMBULATORY_CARE_PROVIDER_SITE_OTHER): Payer: BC Managed Care – PPO | Admitting: Osteopathic Medicine

## 2020-01-10 ENCOUNTER — Telehealth: Payer: Self-pay | Admitting: Osteopathic Medicine

## 2020-01-10 DIAGNOSIS — F3341 Major depressive disorder, recurrent, in partial remission: Secondary | ICD-10-CM | POA: Diagnosis not present

## 2020-01-10 DIAGNOSIS — F909 Attention-deficit hyperactivity disorder, unspecified type: Secondary | ICD-10-CM

## 2020-01-10 MED ORDER — BUPROPION HCL ER (XL) 150 MG PO TB24: 150.0000 mg | ORAL_TABLET | Freq: Every day | ORAL | 3 refills | Status: DC

## 2020-01-10 MED ORDER — AMPHETAMINE-DEXTROAMPHETAMINE 20 MG PO TABS: 20.0000 mg | ORAL_TABLET | Freq: Two times a day (BID) | ORAL | 0 refills | Status: DC

## 2020-01-10 NOTE — Progress Notes (Signed)
Attempted to contact patient, no answer. Left a vm msg for pt to remain logged into MyChart.

## 2020-01-10 NOTE — Progress Notes (Signed)
Telemedicine Visit via  Video & Audio (App used: MyChart)  I connected with Adriana Gordon on 01/10/20 at 10:40 AM  by phone or  telemedicine application as noted above  I verified that I am speaking with or regarding  the correct patient using two identifiers.  Participants: Myself, Dr Sunnie Nielsen DO Patient: Adriana Gordon Patient proxy if applicable: none Other, if applicable: nonw  Patient is in separate location from myself  I am in office at [facility]    I discussed the limitations of evaluation and management  by telemedicine and the availability of in person appointments.  The participant(s) above expressed understanding and  agreed to proceed with this appointment via telemedicine.       History of Present Illness: Adriana Gordon is a 54 y.o. female who would like to discuss mental health / ADD    ADD/mental health: Adderall IR 20 mg bid - last filled 07/17/19 for #180 Has been off for a bit intermittenly, few surgeries this year Also on Wellbutrin and doing well w/ moods   BP Readings from Last 3 Encounters:  01/10/20 111/67  12/01/19 (!) 162/95  07/12/19 (!) 138/97   Wt Readings from Last 3 Encounters:  01/10/20 130 lb (59 kg)  12/01/19 127 lb (57.6 kg)  07/12/19 128 lb 1.3 oz (58.1 kg)   Immunization History  Administered Date(s) Administered  . Influenza,inj,Quad PF,6+ Mos 10/26/2017, 09/16/2018  . Influenza-Unspecified 09/16/2018  . Janssen (J&J) SARS-COV-2 Vaccination 04/18/2019  . PFIZER SARS-COV-2 Vaccination 11/29/2019  . Tdap 03/31/2017      Observations/Objective: BP 111/67   Temp 98 F (36.7 C)   Wt 130 lb (59 kg)   BMI 25.39 kg/m  BP Readings from Last 3 Encounters:  01/10/20 111/67  12/01/19 (!) 162/95  07/12/19 (!) 138/97   Exam: Normal Speech.  NAD  Lab and Radiology Results No results found for this or any previous visit (from the past 72 hour(s)). No results found.     Assessment and Plan: 54 y.o.  female with Diagnoses of Adult ADHD and Recurrent major depressive disorder, in partial remission (HCC) were pertinent to this visit.   PDMP reviewed during this encounter. No orders of the defined types were placed in this encounter.  Meds ordered this encounter  Medications  . buPROPion (WELLBUTRIN XL) 150 MG 24 hr tablet    Sig: Take 1 tablet (150 mg total) by mouth daily.    Dispense:  90 tablet    Refill:  3  . amphetamine-dextroamphetamine (ADDERALL) 20 MG tablet    Sig: Take 1 tablet (20 mg total) by mouth 2 (two) times daily.    Dispense:  180 tablet    Refill:  0    If #180 for 90 days not permitted, please use separate Rx 1) Disp #60 to fill 05/03/19 Refill x0; 2) Disp #60 to fill in 30 days from Rx date Refill x0; 3) Disp #60 to fill in 60 days from Rx date Refill x0     Follow Up Instructions: Return in about 6 months (around 07/10/2020).    I discussed the assessment and treatment plan with the patient. The patient was provided an opportunity to ask questions and all were answered. The patient agreed with the plan and demonstrated an understanding of the instructions.   The patient was advised to call back or seek an in-person evaluation if any new concerns, if symptoms worsen or if the condition fails to improve as anticipated.  20 minutes of  non-face-to-face time was provided during this encounter.      . . . . . . . . . . . . . Marland Kitchen                   Historical information moved to improve visibility of documentation.  Past Medical History:  Diagnosis Date  . ADHD   . Allergic rhinitis   . Asthma    childhood  . Depression   . External hemorrhoids   . Hemorrhoids   . History of menorrhagia   . Hypertension    GETS WHITE COAT SYNDROME AT TIMES  . 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 Nabothian cyst 07/14/2016  . OA (osteoarthritis)   . Ovarian cyst    Past Surgical History:  Procedure Laterality Date  .  BACK SURGERY    . BREAST SURGERY     reduction  . CERVICAL FUSION  11/30/2018   C 5 TO C 7, C 4 TO C 5 SECOND SX  . CESAREAN SECTION    . COLONOSCOPY  2017  . ENDOMETRIAL ABLATION  2006  . EVALUATION UNDER ANESTHESIA WITH HEMORRHOIDECTOMY N/A 03/30/2019   Procedure: SINGLE COLUMN HEMORRHOIDECTOMY;  Surgeon: Romie Levee, MD;  Location: [facility];  Service: General;  Laterality: N/A;  . MOUTH SURGERY  03/21/2019  . MYRINGOTOMY Left 02/07/2019  . REDUCTION MAMMAPLASTY    . TONSILLECTOMY     AND ADENOIDS  . UPPER GI ENDOSCOPY     Social History   Tobacco Use  . Smoking status: Never Smoker  . Smokeless tobacco: Never Used  Substance Use Topics  . Alcohol use: Yes    Comment: socially   family history includes Alcohol abuse in her mother; Asthma in her father; COPD in her father; Cancer in her father; Depression in her brother and mother; Diabetes in her maternal grandmother; Heart attack in her paternal grandfather; Hyperlipidemia in her father; Hypertension in her father and mother; Prostate cancer in her father; Rheum arthritis in her cousin; Sjogren's syndrome in her mother; Stroke in her maternal grandfather.  Medications: Current Outpatient Medications  Medication Sig Dispense Refill  . Albuterol Sulfate (PROAIR RESPICLICK) 108 (90 Base) MCG/ACT AEPB Inhale 1-2 puffs into the lungs every 4 (four) hours as needed (wheezing/shortness of breath). 1 each 3  . amphetamine-dextroamphetamine (ADDERALL) 20 MG tablet Take 1 tablet (20 mg total) by mouth 2 (two) times daily. 180 tablet 0  . buPROPion (WELLBUTRIN XL) 150 MG 24 hr tablet Take 1 tablet (150 mg total) by mouth daily. 90 tablet 3  . Calcium Carbonate-Vitamin D 600-400 MG-UNIT tablet Take 1 tablet by mouth 2 (two) times daily. 60 tablet 11  . Cholecalciferol (VITAMIN D3) 2000 units CHEW Chew by mouth daily.    . cyclobenzaprine (FLEXERIL) 10 MG tablet Take 1 tablet (10 mg total) by mouth 3 (three) times daily  as needed for muscle spasms. 270 tablet 2   No current facility-administered medications for this visit.   Allergies  Allergen Reactions  . Phenergan [Promethazine Hcl]     "HYPERACTIVE AND JUMPY"  . Warfarin And Related     " DUE TO METABOLIZES MEDS SHOULD NOT TAKE, MAY OVERWORK ON PATIENT"

## 2020-01-10 NOTE — Telephone Encounter (Signed)
Encounter opened in error

## 2020-01-14 DIAGNOSIS — M12272 Villonodular synovitis (pigmented), left ankle and foot: Secondary | ICD-10-CM | POA: Diagnosis not present

## 2020-01-21 DIAGNOSIS — M12272 Villonodular synovitis (pigmented), left ankle and foot: Secondary | ICD-10-CM | POA: Diagnosis not present

## 2020-02-07 DIAGNOSIS — M12272 Villonodular synovitis (pigmented), left ankle and foot: Secondary | ICD-10-CM | POA: Diagnosis not present

## 2020-02-25 DIAGNOSIS — M25672 Stiffness of left ankle, not elsewhere classified: Secondary | ICD-10-CM | POA: Diagnosis not present

## 2020-02-25 DIAGNOSIS — M25572 Pain in left ankle and joints of left foot: Secondary | ICD-10-CM | POA: Diagnosis not present

## 2020-02-25 DIAGNOSIS — M25675 Stiffness of left foot, not elsewhere classified: Secondary | ICD-10-CM | POA: Diagnosis not present

## 2020-02-25 DIAGNOSIS — M25475 Effusion, left foot: Secondary | ICD-10-CM | POA: Diagnosis not present

## 2020-02-28 DIAGNOSIS — M25572 Pain in left ankle and joints of left foot: Secondary | ICD-10-CM | POA: Diagnosis not present

## 2020-02-28 DIAGNOSIS — M25672 Stiffness of left ankle, not elsewhere classified: Secondary | ICD-10-CM | POA: Diagnosis not present

## 2020-02-28 DIAGNOSIS — M25475 Effusion, left foot: Secondary | ICD-10-CM | POA: Diagnosis not present

## 2020-02-28 DIAGNOSIS — M25675 Stiffness of left foot, not elsewhere classified: Secondary | ICD-10-CM | POA: Diagnosis not present

## 2020-03-03 DIAGNOSIS — M25475 Effusion, left foot: Secondary | ICD-10-CM | POA: Diagnosis not present

## 2020-03-03 DIAGNOSIS — M25572 Pain in left ankle and joints of left foot: Secondary | ICD-10-CM | POA: Diagnosis not present

## 2020-03-03 DIAGNOSIS — M25672 Stiffness of left ankle, not elsewhere classified: Secondary | ICD-10-CM | POA: Diagnosis not present

## 2020-03-03 DIAGNOSIS — M25675 Stiffness of left foot, not elsewhere classified: Secondary | ICD-10-CM | POA: Diagnosis not present

## 2020-03-05 DIAGNOSIS — Z6826 Body mass index (BMI) 26.0-26.9, adult: Secondary | ICD-10-CM | POA: Diagnosis not present

## 2020-03-05 DIAGNOSIS — M25672 Stiffness of left ankle, not elsewhere classified: Secondary | ICD-10-CM | POA: Diagnosis not present

## 2020-03-05 DIAGNOSIS — M25572 Pain in left ankle and joints of left foot: Secondary | ICD-10-CM | POA: Diagnosis not present

## 2020-03-05 DIAGNOSIS — M5412 Radiculopathy, cervical region: Secondary | ICD-10-CM | POA: Diagnosis not present

## 2020-03-05 DIAGNOSIS — I10 Essential (primary) hypertension: Secondary | ICD-10-CM | POA: Diagnosis not present

## 2020-03-05 DIAGNOSIS — M25675 Stiffness of left foot, not elsewhere classified: Secondary | ICD-10-CM | POA: Diagnosis not present

## 2020-03-05 DIAGNOSIS — M25475 Effusion, left foot: Secondary | ICD-10-CM | POA: Diagnosis not present

## 2020-03-11 DIAGNOSIS — M25572 Pain in left ankle and joints of left foot: Secondary | ICD-10-CM | POA: Diagnosis not present

## 2020-03-11 DIAGNOSIS — M25675 Stiffness of left foot, not elsewhere classified: Secondary | ICD-10-CM | POA: Diagnosis not present

## 2020-03-11 DIAGNOSIS — M25672 Stiffness of left ankle, not elsewhere classified: Secondary | ICD-10-CM | POA: Diagnosis not present

## 2020-03-11 DIAGNOSIS — M25475 Effusion, left foot: Secondary | ICD-10-CM | POA: Diagnosis not present

## 2020-03-18 DIAGNOSIS — M25672 Stiffness of left ankle, not elsewhere classified: Secondary | ICD-10-CM | POA: Diagnosis not present

## 2020-03-18 DIAGNOSIS — M25475 Effusion, left foot: Secondary | ICD-10-CM | POA: Diagnosis not present

## 2020-03-18 DIAGNOSIS — M25572 Pain in left ankle and joints of left foot: Secondary | ICD-10-CM | POA: Diagnosis not present

## 2020-03-18 DIAGNOSIS — M25675 Stiffness of left foot, not elsewhere classified: Secondary | ICD-10-CM | POA: Diagnosis not present

## 2020-03-19 DIAGNOSIS — M12272 Villonodular synovitis (pigmented), left ankle and foot: Secondary | ICD-10-CM | POA: Diagnosis not present

## 2020-03-20 DIAGNOSIS — M25572 Pain in left ankle and joints of left foot: Secondary | ICD-10-CM | POA: Diagnosis not present

## 2020-03-20 DIAGNOSIS — M25475 Effusion, left foot: Secondary | ICD-10-CM | POA: Diagnosis not present

## 2020-03-20 DIAGNOSIS — M25675 Stiffness of left foot, not elsewhere classified: Secondary | ICD-10-CM | POA: Diagnosis not present

## 2020-03-20 DIAGNOSIS — M25672 Stiffness of left ankle, not elsewhere classified: Secondary | ICD-10-CM | POA: Diagnosis not present

## 2020-04-01 DIAGNOSIS — M5412 Radiculopathy, cervical region: Secondary | ICD-10-CM | POA: Diagnosis not present

## 2020-04-07 DIAGNOSIS — M5412 Radiculopathy, cervical region: Secondary | ICD-10-CM | POA: Diagnosis not present

## 2020-04-07 DIAGNOSIS — G5623 Lesion of ulnar nerve, bilateral upper limbs: Secondary | ICD-10-CM | POA: Diagnosis not present

## 2020-04-07 DIAGNOSIS — G5603 Carpal tunnel syndrome, bilateral upper limbs: Secondary | ICD-10-CM | POA: Diagnosis not present

## 2020-04-10 DIAGNOSIS — I10 Essential (primary) hypertension: Secondary | ICD-10-CM | POA: Diagnosis not present

## 2020-04-10 DIAGNOSIS — M5412 Radiculopathy, cervical region: Secondary | ICD-10-CM | POA: Diagnosis not present

## 2020-04-10 DIAGNOSIS — Z6825 Body mass index (BMI) 25.0-25.9, adult: Secondary | ICD-10-CM | POA: Diagnosis not present

## 2020-04-24 DIAGNOSIS — M542 Cervicalgia: Secondary | ICD-10-CM | POA: Diagnosis not present

## 2020-04-24 DIAGNOSIS — M5412 Radiculopathy, cervical region: Secondary | ICD-10-CM | POA: Diagnosis not present

## 2020-05-08 DIAGNOSIS — G5603 Carpal tunnel syndrome, bilateral upper limbs: Secondary | ICD-10-CM | POA: Diagnosis not present

## 2020-05-11 ENCOUNTER — Other Ambulatory Visit: Payer: Self-pay | Admitting: Sports Medicine

## 2020-05-11 DIAGNOSIS — M5030 Other cervical disc degeneration, unspecified cervical region: Secondary | ICD-10-CM

## 2020-05-22 DIAGNOSIS — G5602 Carpal tunnel syndrome, left upper limb: Secondary | ICD-10-CM | POA: Diagnosis not present

## 2020-06-01 ENCOUNTER — Emergency Department: Admit: 2020-06-01 | Discharge status: Absent without leave | Payer: Self-pay

## 2020-07-09 ENCOUNTER — Other Ambulatory Visit: Payer: Self-pay

## 2020-07-09 ENCOUNTER — Encounter: Payer: Self-pay | Admitting: Osteopathic Medicine

## 2020-07-09 ENCOUNTER — Ambulatory Visit (INDEPENDENT_AMBULATORY_CARE_PROVIDER_SITE_OTHER): Payer: BC Managed Care – PPO | Admitting: Osteopathic Medicine

## 2020-07-09 VITALS — BP 148/89 | HR 105 | Temp 98.0°F | Wt 131.0 lb

## 2020-07-09 DIAGNOSIS — F3341 Major depressive disorder, recurrent, in partial remission: Secondary | ICD-10-CM | POA: Diagnosis not present

## 2020-07-09 DIAGNOSIS — Z0000 Encounter for general adult medical examination without abnormal findings: Secondary | ICD-10-CM | POA: Diagnosis not present

## 2020-07-09 DIAGNOSIS — F909 Attention-deficit hyperactivity disorder, unspecified type: Secondary | ICD-10-CM

## 2020-07-09 DIAGNOSIS — Z23 Encounter for immunization: Secondary | ICD-10-CM

## 2020-07-09 DIAGNOSIS — Z1231 Encounter for screening mammogram for malignant neoplasm of breast: Secondary | ICD-10-CM

## 2020-07-09 DIAGNOSIS — E7800 Pure hypercholesterolemia, unspecified: Secondary | ICD-10-CM

## 2020-07-09 DIAGNOSIS — R030 Elevated blood-pressure reading, without diagnosis of hypertension: Secondary | ICD-10-CM

## 2020-07-09 MED ORDER — BUPROPION HCL ER (XL) 150 MG PO TB24: 150.0000 mg | ORAL_TABLET | Freq: Every day | ORAL | 3 refills | Status: DC

## 2020-07-09 NOTE — Patient Instructions (Signed)
Since computer update, patient instructions are not coming through or printing properly on the After Visit Summary. Please see attached for patient instructions from today's visit!   

## 2020-07-09 NOTE — Progress Notes (Signed)
Adriana Gordon is a 55 y.o. female who presents to  [facility] at [facility]  today, 07/09/20, seeking care for the following:  Annual physical  BP above goal, monitoring at home and numbers have been arouns 130/80 or less   BP Readings from Last 3 Encounters:  07/09/20 (!) 148/89  01/10/20 111/67  12/01/19 (!) 162/95    ASSESSMENT & PLAN with other pertinent findings:  The primary encounter diagnosis was Annual physical exam. Diagnoses of Need for shingles vaccine, Adult ADHD, Recurrent major depressive disorder, in partial remission (HCC), Borderline hypercholesterolemia, Elevated blood-pressure reading without diagnosis of hypertension, and Breast cancer screening by mammogram were also pertinent to this visit.     Orders Placed This Encounter  Procedures   MM 3D SCREEN BREAST BILATERAL   Varicella-zoster vaccine IM (Shingrix)   CBC   COMPLETE METABOLIC PANEL WITH GFR   Lipid panel   TSH    No orders of the defined types were placed in this encounter.  Patient Instructions  Since computer update, patient instructions are not coming through or printing properly on the After Visit Summary. Please see attached for patient instructions from today's visit!   PRINTED FOR PATIENT:   General Preventive Care Most recent routine screening labs: ordered today Blood pressure goal 140/90 or less. Tobacco: don't! Alcohol: responsible moderation is ok for most adults - if you have concerns about your alcohol intake, please talk to me! Exercise: as tolerated to reduce risk of cardiovascular disease and diabetes. Strength training will also prevent osteoporosis. Mental health: if need for mental health care (adjust medicines, counseling, other), or concerns about moods, please let me know! Sexual / Reproductive health: if need for STI testing, or if concerns with libido/pain problems, please let me know! Advanced Directive: Living Will and/or  Healthcare Power of Attorney recommended for all adults, regardless of age or health. Vaccines Flu vaccine: for almost everyone, every fall. Shingles vaccine: after age 15. Pneumonia vaccines: after age 82 Tetanus booster: every 10 years COVID vaccine: vaccine and boosters recommended! Cancer screenings Colon cancer screening: for everyone age 65-75. Due 01/2025. Breast cancer screening: mammogram annually after age 26 - ordered. Cervical cancer screening: Pap due 07/2021 Lung cancer screening: not needed for non-smokers Infection screenings HIV: recommended screening at least once age 30-65, more often as needed. Gonorrhea/Chlamydia, other STI: screening as needed Hepatitis C: recommended once for everyone age 45-75 TB: certain at-risk populations, or depending on work requirements and/or travel history Other Bone Density Test: recommended for women at age 68   See below for relevant physical exam findings  See below for recent lab and imaging results reviewed  Medications, allergies, PMH, PSH, SocH, FamH reviewed below    Follow-up instructions: Return for NURSE VISIT SHINGRIX #2 IN 2-6 MOS .                                        Exam:  BP (!) 148/89 (BP Location: Left Arm, Patient Position: Sitting, Cuff Size: Normal)   Pulse (!) 105   Temp 98 F (36.7 C) (Oral)   Wt 131 lb (59.4 kg)   BMI 25.58 kg/m  Constitutional: VS see above. General Appearance: alert, well-developed, well-nourished, NAD Neck: No masses, trachea midline.  Respiratory: Normal respiratory effort. no wheeze, no rhonchi, no rales Cardiovascular: S1/S2 normal, no murmur, no rub/gallop auscultated. RRR.  Musculoskeletal:  Gait normal. Symmetric and independent movement of all extremities Abdominal: non-tender, non-distended, no appreciable organomegaly, neg Murphy's, BS [id number] Neurological: Normal balance/coordination. No tremor. Skin: warm, dry, intact.   Psychiatric: Normal judgment/insight. Normal mood and affect. Oriented x3.   Current Meds  Medication Sig   Albuterol Sulfate (PROAIR RESPICLICK) 108 (90 Base) MCG/ACT AEPB Inhale 1-2 puffs into the lungs every 4 (four) hours as needed (wheezing/shortness of breath).   Calcium Carbonate-Vitamin D 600-400 MG-UNIT tablet Take 1 tablet by mouth 2 (two) times daily.   Cholecalciferol (VITAMIN D3) 2000 units CHEW Chew by mouth daily.   cyclobenzaprine (FLEXERIL) 10 MG tablet TAKE ONE TABLET BY MOUTH THREE TIMES A DAY AS NEEDED FOR MUSCLE SPASM   gabapentin (NEURONTIN) 100 MG capsule Take 100 mg by mouth 3 (three) times daily.   [DISCONTINUED] buPROPion (WELLBUTRIN XL) 150 MG 24 hr tablet Take 1 tablet (150 mg total) by mouth daily.    Allergies  Allergen Reactions   Phenergan [Promethazine Hcl]     "HYPERACTIVE AND JUMPY"   Warfarin And Related     " DUE TO METABOLIZES MEDS SHOULD NOT TAKE, MAY OVERWORK ON PATIENT"    Patient Active Problem List   Diagnosis Date Noted   Extrinsic asthma 01/31/2018   Eustachian tube disorder, bilateral 01/31/2018   Adult ADHD 11/09/2017   Elevated blood-pressure reading without diagnosis of hypertension 02/01/2017   DDD (degenerative disc disease), cervical 12/17/2016   Primary osteoarthritis of first carpometacarpal joints, bilateral 11/19/2016   Perennial allergic rhinitis 11/19/2016   Borderline hypercholesterolemia 07/15/2016   Nabothian cyst 07/14/2016    Family History  Problem Relation Age of Onset   Hypertension Mother    Depression Mother    Sjogren's syndrome Mother    Alcohol abuse Mother    Cancer Father    Asthma Father    COPD Father    Hypertension Father    Hyperlipidemia Father    Prostate cancer Father    Depression Brother    Diabetes Maternal Grandmother    Stroke Maternal Grandfather    Heart attack Paternal Grandfather    Rheum arthritis Cousin     Social History   Tobacco Use  Smoking Status Never  Smokeless  Tobacco Never    Past Surgical History:  Procedure Laterality Date   BACK SURGERY     BREAST SURGERY     reduction   CERVICAL FUSION  11/30/2018   C 5 TO C 7, C 4 TO C 5 SECOND SX   CESAREAN SECTION     COLONOSCOPY  2017   ENDOMETRIAL ABLATION  2006   EVALUATION UNDER ANESTHESIA WITH HEMORRHOIDECTOMY N/A 03/30/2019   Procedure: SINGLE COLUMN HEMORRHOIDECTOMY;  Surgeon: Romie Levee, MD;  Location: [facility];  Service: General;  Laterality: N/A;   MOUTH SURGERY  03/21/2019   MYRINGOTOMY Left 02/07/2019   REDUCTION MAMMAPLASTY     TONSILLECTOMY     AND ADENOIDS   UPPER GI ENDOSCOPY      Immunization History  Administered Date(s) Administered   Influenza,inj,Quad PF,6+ Mos 10/26/2017, 09/16/2018   Influenza-Unspecified 09/16/2018   Janssen (J&J) SARS-COV-2 Vaccination 04/18/2019   PFIZER(Purple Top)SARS-COV-2 Vaccination 11/29/2019   Tdap 03/31/2017   Zoster Recombinat (Shingrix) 07/09/2020    No results found for this or any previous visit (from the past 2160 hour(s)).  No results found.     All questions at time of visit were answered - patient instructed to contact office with any additional concerns or updates. ER/RTC precautions  were reviewed with the patient as applicable.   Please note: manual typing as well as voice recognition software may have been used to produce this document - typos may escape review. Please contact Dr. Lyn Hollingshead for any needed clarifications.  Marland Kitchen

## 2020-07-10 DIAGNOSIS — G5601 Carpal tunnel syndrome, right upper limb: Secondary | ICD-10-CM | POA: Diagnosis not present

## 2020-07-10 DIAGNOSIS — G5603 Carpal tunnel syndrome, bilateral upper limbs: Secondary | ICD-10-CM | POA: Diagnosis not present

## 2020-07-18 LAB — COMPLETE METABOLIC PANEL WITH GFR
AG Ratio: 1.6 (calc) (ref 1.0–2.5)
ALT: 28 U/L (ref 6–29)
AST: 20 U/L (ref 10–35)
Albumin: 4.4 g/dL (ref 3.6–5.1)
Alkaline phosphatase (APISO): 89 U/L (ref 37–153)
BUN: 21 mg/dL (ref 7–25)
CO2: 29 mmol/L (ref 20–32)
Calcium: 9.6 mg/dL (ref 8.6–10.4)
Chloride: 102 mmol/L (ref 98–110)
Creat: 0.94 mg/dL (ref 0.50–1.05)
GFR, Est African American: 79 mL/min/{1.73_m2} (ref >=60)
GFR, Est Non African American: 68 mL/min/{1.73_m2} (ref >=60)
Globulin: 2.7 g/dL (calc) (ref 1.9–3.7)
Glucose, Bld: 83 mg/dL (ref 65–99)
Potassium: 4.8 mmol/L (ref 3.5–5.3)
Sodium: 139 mmol/L (ref 135–146)
Total Bilirubin: 0.5 mg/dL (ref 0.2–1.2)
Total Protein: 7.1 g/dL (ref 6.1–8.1)

## 2020-07-18 LAB — LIPID PANEL
Cholesterol: 232 mg/dL — ABNORMAL HIGH (ref <200)
HDL: 59 mg/dL (ref >=50)
LDL Cholesterol (Calc): 150 mg/dL (calc) — ABNORMAL HIGH
Non-HDL Cholesterol (Calc): 173 mg/dL (calc) — ABNORMAL HIGH (ref <130)
Total CHOL/HDL Ratio: 3.9 (calc) (ref <5.0)
Triglycerides: 114 mg/dL (ref <150)

## 2020-07-18 LAB — CBC
HCT: 40.3 % (ref 35.0–45.0)
Hemoglobin: 13 g/dL (ref 11.7–15.5)
MCH: 30 pg (ref 27.0–33.0)
MCHC: 32.3 g/dL (ref 32.0–36.0)
MCV: 93.1 fL (ref 80.0–100.0)
MPV: 10.3 fL (ref 7.5–12.5)
Platelets: 355 10*3/uL (ref 140–400)
RBC: 4.33 10*6/uL (ref 3.80–5.10)
RDW: 13.4 % (ref 11.0–15.0)
WBC: 6.1 10*3/uL (ref 3.8–10.8)

## 2020-07-18 LAB — TSH: TSH: 2.54 mIU/L

## 2020-07-24 ENCOUNTER — Other Ambulatory Visit: Payer: Self-pay

## 2020-07-24 ENCOUNTER — Ambulatory Visit (INDEPENDENT_AMBULATORY_CARE_PROVIDER_SITE_OTHER): Payer: 59

## 2020-07-24 DIAGNOSIS — Z1231 Encounter for screening mammogram for malignant neoplasm of breast: Secondary | ICD-10-CM

## 2020-10-06 ENCOUNTER — Other Ambulatory Visit: Payer: Self-pay

## 2020-10-06 ENCOUNTER — Ambulatory Visit (INDEPENDENT_AMBULATORY_CARE_PROVIDER_SITE_OTHER): Payer: 59 | Admitting: Osteopathic Medicine

## 2020-10-06 VITALS — Temp 98.0°F

## 2020-10-06 DIAGNOSIS — Z23 Encounter for immunization: Secondary | ICD-10-CM | POA: Diagnosis not present

## 2020-10-06 NOTE — Progress Notes (Signed)
Established Patient Office Visit  Subjective:  Patient ID: Adriana Gordon, female    DOB: [birth year]  Age: 55 y.o. MRN: [id number]  CC:  Chief Complaint  Patient presents with   Immunizations    HPI Adriana Gordon presents for flu and shingles vaccine.   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    Nabothian cyst 07/14/2016   OA (osteoarthritis)    Ovarian cyst     Past Surgical History:  Procedure Laterality Date   BACK SURGERY     BREAST SURGERY     reduction   CERVICAL FUSION  11/30/2018   C 5 TO C 7, C 4 TO C 5 SECOND SX   CESAREAN SECTION     COLONOSCOPY  2017   ENDOMETRIAL ABLATION  2006   EVALUATION UNDER ANESTHESIA WITH HEMORRHOIDECTOMY N/A 03/30/2019   Procedure: SINGLE COLUMN HEMORRHOIDECTOMY;  Surgeon: Romie Levee, MD;  Location: [facility];  Service: General;  Laterality: N/A;   MOUTH SURGERY  03/21/2019   MYRINGOTOMY Left 02/07/2019   REDUCTION MAMMAPLASTY     TONSILLECTOMY     AND ADENOIDS   UPPER GI ENDOSCOPY      Family History  Problem Relation Age of Onset   Hypertension Mother    Depression Mother    Sjogren's syndrome Mother    Alcohol abuse Mother    Cancer Father    Asthma Father    COPD Father    Hypertension Father    Hyperlipidemia Father    Prostate cancer Father    Depression Brother    Diabetes Maternal Grandmother    Stroke Maternal Grandfather    Heart attack Paternal Grandfather    Rheum arthritis Cousin     Social History   Socioeconomic History   Marital status: Married    Spouse name: Not on file   Number of children: 2   Years of education: Not on file   Highest education level: Not on file  Occupational History   Occupation: unemployed  Tobacco Use   Smoking  status: Never   Smokeless tobacco: Never  Vaping Use   Vaping Use: Never used  Substance and Sexual Activity   Alcohol use: Yes    Comment: socially   Drug use: No   Sexual activity: Yes    Birth control/protection: Post-menopausal  Other Topics Concern   Not on file  Social History Narrative   Not on file   Social Determinants of Health   Financial Resource Strain: Not on file  Food Insecurity: Not on file  Transportation Needs: Not on file  Physical Activity: Not on file  Stress: Not on file  Social Connections: Not on file  Intimate Partner Violence: Not on file    Outpatient Medications Prior to Visit  Medication Sig Dispense Refill   Albuterol Sulfate (PROAIR RESPICLICK) 108 (90 Base) MCG/ACT AEPB Inhale 1-2 puffs into the lungs every 4 (four) hours as needed (wheezing/shortness of breath). 1 each 3   buPROPion (WELLBUTRIN XL) 150 MG 24 hr tablet Take 1 tablet (150 mg total) by mouth daily. 90 tablet 3   Calcium Carbonate-Vitamin D 600-400 MG-UNIT tablet Take 1 tablet by mouth 2 (  two) times daily. 60 tablet 11   Cholecalciferol (VITAMIN D3) 2000 units CHEW Chew by mouth daily.     cyclobenzaprine (FLEXERIL) 10 MG tablet TAKE ONE TABLET BY MOUTH THREE TIMES A DAY AS NEEDED FOR MUSCLE SPASM 90 tablet 3   gabapentin (NEURONTIN) 100 MG capsule Take 100 mg by mouth 3 (three) times daily.     amphetamine-dextroamphetamine (ADDERALL) 20 MG tablet Take 1 tablet (20 mg total) by mouth 2 (two) times daily. 180 tablet 0   No facility-administered medications prior to visit.    Allergies  Allergen Reactions   Phenergan [Promethazine Hcl]     "HYPERACTIVE AND JUMPY"   Warfarin And Related     " DUE TO METABOLIZES MEDS SHOULD NOT TAKE, MAY OVERWORK ON PATIENT"    ROS Review of Systems    Objective:    Physical Exam  Temp 98 F (36.7 C) (Temporal)  Wt Readings from Last 3 Encounters:  07/09/20 131 lb (59.4 kg)  01/10/20 130 lb (59 kg)  12/01/19 127 lb (57.6 kg)      Health Maintenance Due  Topic Date Due   Hepatitis C Screening  Never done   COVID-19 Vaccine (3 - Booster for Janssen series) 03/28/2020    There are no preventive care reminders to display for this patient.  Lab Results  Component Value Date   TSH 2.54 07/17/2020   Lab Results  Component Value Date   WBC 6.1 07/17/2020   HGB 13.0 07/17/2020   HCT 40.3 07/17/2020   MCV 93.1 07/17/2020   PLT 355 07/17/2020   Lab Results  Component Value Date   NA 139 07/17/2020   K 4.8 07/17/2020   CO2 29 07/17/2020   GLUCOSE 83 07/17/2020   BUN 21 07/17/2020   CREATININE 0.94 07/17/2020   BILITOT 0.5 07/17/2020   ALKPHOS 52 07/13/2016   AST 20 07/17/2020   ALT 28 07/17/2020   PROT 7.1 07/17/2020   ALBUMIN 4.7 07/13/2016   CALCIUM 9.6 07/17/2020   Lab Results  Component Value Date   CHOL 232 (H) 07/17/2020   Lab Results  Component Value Date   HDL 59 07/17/2020   Lab Results  Component Value Date   LDLCALC 150 (H) 07/17/2020   Lab Results  Component Value Date   TRIG 114 07/17/2020   Lab Results  Component Value Date   CHOLHDL 3.9 07/17/2020   Lab Results  Component Value Date   HGBA1C 5.0 07/13/2016      Assessment & Plan:  Vaccines - Patient tolerated injections well without complications. Patient advised to schedule next flu injection 1 year from today.     Problem List Items Addressed This Visit   None Visit Diagnoses     Need for shingles vaccine    -  Primary   Relevant Orders   Varicella-zoster vaccine IM (Shingrix) (Completed)   Needs flu shot       Relevant Orders   Flu Vaccine QUAD 6+ mos PF IM (Fluarix Quad PF) (Completed)       No orders of the defined types were placed in this encounter.   Follow-up: Return in about 1 year (around 10/06/2021) for flu vaccine.Earna Coder, Janalyn Harder, CMA

## 2020-10-22 ENCOUNTER — Other Ambulatory Visit: Payer: Self-pay | Admitting: Osteopathic Medicine

## 2020-10-22 DIAGNOSIS — F909 Attention-deficit hyperactivity disorder, unspecified type: Secondary | ICD-10-CM

## 2020-10-24 ENCOUNTER — Other Ambulatory Visit: Payer: Self-pay | Admitting: Neurology

## 2020-10-24 ENCOUNTER — Other Ambulatory Visit: Payer: Self-pay

## 2020-10-24 DIAGNOSIS — F909 Attention-deficit hyperactivity disorder, unspecified type: Secondary | ICD-10-CM

## 2020-10-24 MED ORDER — AMPHETAMINE-DEXTROAMPHETAMINE 20 MG PO TABS: 20.0000 mg | ORAL_TABLET | Freq: Two times a day (BID) | ORAL | 0 refills | Status: DC

## 2020-10-24 MED ORDER — AMPHETAMINE-DEXTROAMPHETAMINE 20 MG PO TABS
20.0000 mg | ORAL_TABLET | Freq: Two times a day (BID) | ORAL | 0 refills | Status: DC
  Filled 2020-10-24: qty 180, 90d supply, fill #0

## 2020-10-24 NOTE — Progress Notes (Signed)
Patient asked [place] to send to [facility] since [facility]. Thanks.

## 2020-10-24 NOTE — Progress Notes (Signed)
Sent!

## 2020-11-18 ENCOUNTER — Other Ambulatory Visit: Payer: Self-pay | Admitting: Physician Assistant

## 2020-11-18 DIAGNOSIS — F909 Attention-deficit hyperactivity disorder, unspecified type: Secondary | ICD-10-CM

## 2020-11-19 MED ORDER — AMPHETAMINE-DEXTROAMPHETAMINE 20 MG PO TABS: 20.0000 mg | ORAL_TABLET | Freq: Two times a day (BID) | ORAL | 0 refills | Status: DC

## 2021-01-23 ENCOUNTER — Encounter: Payer: Self-pay | Admitting: Sports Medicine

## 2021-01-23 ENCOUNTER — Telehealth (INDEPENDENT_AMBULATORY_CARE_PROVIDER_SITE_OTHER): Payer: Self-pay | Admitting: Sports Medicine

## 2021-01-23 DIAGNOSIS — J4520 Mild intermittent asthma, uncomplicated: Secondary | ICD-10-CM

## 2021-01-23 MED ORDER — PROAIR RESPICLICK 108 (90 BASE) MCG/ACT IN AEPB: 1.0000 | INHALATION_SPRAY | RESPIRATORY_TRACT | 3 refills | Status: DC | PRN

## 2021-01-23 MED ORDER — HYDROCOD POLST-CPM POLST ER 10-8 MG/5ML PO SUER: 5.0000 mL | Freq: Two times a day (BID) | ORAL | 0 refills | Status: DC | PRN

## 2021-01-23 MED ORDER — AZITHROMYCIN 250 MG PO TABS: ORAL_TABLET | ORAL | 0 refills | Status: DC

## 2021-01-23 MED ORDER — BENZONATATE 200 MG PO CAPS: 200.0000 mg | ORAL_CAPSULE | Freq: Three times a day (TID) | ORAL | 0 refills | Status: DC | PRN

## 2021-01-23 MED ORDER — PREDNISONE 50 MG PO TABS: 50.0000 mg | ORAL_TABLET | Freq: Every day | ORAL | 0 refills | Status: DC

## 2021-01-23 NOTE — Progress Notes (Signed)
° °  Virtual Visit via WebEx/MyChart   I connected with  Adriana Gordon  on 01/23/21 via WebEx/MyChart/Doximity Video and verified that I am speaking with the correct person using two identifiers.   I discussed the limitations, risks, security and privacy concerns of performing an evaluation and management service by WebEx/MyChart/Doximity Video, including the higher likelihood of inaccurate diagnosis and treatment, and the availability of in person appointments.  We also discussed the likely need of an additional face to face encounter for complete and high quality delivery of care.  I also discussed with the patient that there may be a patient responsible charge related to this service. The patient expressed understanding and wishes to proceed.  Provider location is in medical facility. Patient location is at their home, different from provider location. People involved in care of the patient during this telehealth encounter were myself, my nurse/medical assistant, and my front office/scheduling team member.  Review of Systems: No fevers, chills, night sweats, weight loss, chest pain, or shortness of breath.   Objective Findings:    General: Speaking full sentences, no audible heavy breathing.  Sounds alert and appropriately interactive.  Appears well.  Face symmetric.  Extraocular movements intact.  Pupils equal and round.  No nasal flaring or accessory muscle use visualized.  Independent interpretation of tests performed by another provider:   None.  Brief History, Exam, Impression, and Recommendations:    Extrinsic asthma This is a very pleasant 56 year old female, she has had about a week of cough, runny nose, needing to use her inhaler more often, multiple sick contacts, negative COVID test. No fevers, chills, muscle aches, body aches, GI symptoms. She does have asthma so we will treat this aggressively, prednisone, azithromycin, cough is one of the worst symptoms we will add Tessalon  Perles, Tussionex at night. Refilling inhaler per her request, return to see me as needed.   I discussed the above assessment and treatment plan with the patient. The patient was provided an opportunity to ask questions and all were answered. The patient agreed with the plan and demonstrated an understanding of the instructions.   The patient was advised to call back or seek an in-person evaluation if the symptoms worsen or if the condition fails to improve as anticipated.   I provided 30 minutes of face to face and non-face-to-face time during this encounter date, time was needed to gather information, review chart, records, communicate/coordinate with staff remotely, as well as complete documentation.   ___________________________________________ Gwen Her. Dianah Field, M.D., ABFM., CAQSM. Primary Care and [place] Instructor of [employer] [facility] [employer]

## 2021-01-23 NOTE — Assessment & Plan Note (Signed)
This is a very pleasant 56 year old female, she has had about a week of cough, runny nose, needing to use her inhaler more often, multiple sick contacts, negative COVID test. No fevers, chills, muscle aches, body aches, GI symptoms. She does have asthma so we will treat this aggressively, prednisone, azithromycin, cough is one of the worst symptoms we will add Tessalon Perles, Tussionex at night. Refilling inhaler per her request, return to see me as needed.

## 2021-04-17 ENCOUNTER — Other Ambulatory Visit: Payer: Self-pay | Admitting: Sports Medicine

## 2021-04-17 ENCOUNTER — Other Ambulatory Visit: Payer: Self-pay | Admitting: Osteopathic Medicine

## 2021-04-17 DIAGNOSIS — F3341 Major depressive disorder, recurrent, in partial remission: Secondary | ICD-10-CM

## 2021-04-17 DIAGNOSIS — M5030 Other cervical disc degeneration, unspecified cervical region: Secondary | ICD-10-CM

## 2021-04-17 DIAGNOSIS — F909 Attention-deficit hyperactivity disorder, unspecified type: Secondary | ICD-10-CM

## 2021-05-13 ENCOUNTER — Other Ambulatory Visit: Payer: Self-pay

## 2021-05-13 DIAGNOSIS — M5030 Other cervical disc degeneration, unspecified cervical region: Secondary | ICD-10-CM

## 2021-05-13 MED ORDER — CYCLOBENZAPRINE HCL 10 MG PO TABS
ORAL_TABLET | ORAL | 0 refills | Status: DC
Start: 2021-05-13 — End: 2021-09-07

## 2021-07-27 ENCOUNTER — Encounter: Payer: Self-pay | Admitting: Family Medicine

## 2021-07-27 ENCOUNTER — Other Ambulatory Visit: Payer: Self-pay

## 2021-07-27 DIAGNOSIS — F3341 Major depressive disorder, recurrent, in partial remission: Secondary | ICD-10-CM

## 2021-07-27 DIAGNOSIS — F909 Attention-deficit hyperactivity disorder, unspecified type: Secondary | ICD-10-CM

## 2021-07-27 MED ORDER — BUPROPION HCL ER (XL) 150 MG PO TB24: 150.0000 mg | ORAL_TABLET | Freq: Every day | ORAL | 0 refills | Status: DC

## 2021-08-06 ENCOUNTER — Ambulatory Visit: Payer: 59 | Admitting: Family Medicine

## 2021-08-24 ENCOUNTER — Other Ambulatory Visit: Payer: Self-pay | Admitting: Family Medicine

## 2021-08-24 DIAGNOSIS — Z1231 Encounter for screening mammogram for malignant neoplasm of breast: Secondary | ICD-10-CM

## 2021-08-27 ENCOUNTER — Ambulatory Visit (INDEPENDENT_AMBULATORY_CARE_PROVIDER_SITE_OTHER): Payer: 59

## 2021-08-27 DIAGNOSIS — Z1231 Encounter for screening mammogram for malignant neoplasm of breast: Secondary | ICD-10-CM

## 2021-09-07 ENCOUNTER — Encounter: Payer: Self-pay | Admitting: Family Medicine

## 2021-09-07 ENCOUNTER — Ambulatory Visit (INDEPENDENT_AMBULATORY_CARE_PROVIDER_SITE_OTHER): Payer: 59 | Admitting: Family Medicine

## 2021-09-07 VITALS — BP 165/90 | HR 102 | Temp 99.2°F | Ht 60.0 in | Wt 127.0 lb

## 2021-09-07 DIAGNOSIS — R69 Illness, unspecified: Secondary | ICD-10-CM | POA: Diagnosis not present

## 2021-09-07 DIAGNOSIS — Z23 Encounter for immunization: Secondary | ICD-10-CM | POA: Diagnosis not present

## 2021-09-07 DIAGNOSIS — F3341 Major depressive disorder, recurrent, in partial remission: Secondary | ICD-10-CM | POA: Diagnosis not present

## 2021-09-07 DIAGNOSIS — E7800 Pure hypercholesterolemia, unspecified: Secondary | ICD-10-CM | POA: Diagnosis not present

## 2021-09-07 DIAGNOSIS — F909 Attention-deficit hyperactivity disorder, unspecified type: Secondary | ICD-10-CM

## 2021-09-07 DIAGNOSIS — Z8639 Personal history of other endocrine, nutritional and metabolic disease: Secondary | ICD-10-CM

## 2021-09-07 DIAGNOSIS — R030 Elevated blood-pressure reading, without diagnosis of hypertension: Secondary | ICD-10-CM | POA: Diagnosis not present

## 2021-09-07 MED ORDER — AMPHETAMINE-DEXTROAMPHETAMINE 20 MG PO TABS: 20.0000 mg | ORAL_TABLET | Freq: Two times a day (BID) | ORAL | 0 refills | Status: DC

## 2021-09-07 MED ORDER — BUPROPION HCL ER (XL) 150 MG PO TB24: 150.0000 mg | ORAL_TABLET | Freq: Every day | ORAL | 3 refills | Status: DC

## 2021-09-13 NOTE — Progress Notes (Signed)
Adriana Gordon - 56 y.o. female MRN [id number]  Date of birth: 08/21/1965  Subjective Chief Complaint  Patient presents with   Transitions Of Care    HPI Adriana Gordon is a 56 year old female here today for follow-up visit.  She is a former patient of Dr. Lyn Hollingshead.  She has history of ADHD and is currently managed with Adderall 20 mg twice daily.  This has worked quite well for her.  Denies side effects of medication including increased anxiety, insomnia or significant appetite suppression.  She does need a renewal of this at this time.  She also continues on bupropion for management of depression as well as adjunct of treatment for ADHD.  No side effects with this.  She is due for updated labs.  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    Nabothian cyst 07/14/2016   OA (osteoarthritis)    Ovarian cyst     Past Surgical History:  Procedure Laterality Date   BACK SURGERY     BREAST SURGERY     reduction   CERVICAL FUSION  11/30/2018   C 5 TO C 7, C 4 TO C 5 SECOND SX   CESAREAN SECTION     COLONOSCOPY  2017   ENDOMETRIAL ABLATION  2006   EVALUATION UNDER ANESTHESIA WITH HEMORRHOIDECTOMY N/A 03/30/2019   Procedure: SINGLE COLUMN HEMORRHOIDECTOMY;  Surgeon: Romie Levee, MD;  Location: [facility];  Service: General;  Laterality: N/A;   MOUTH SURGERY  03/21/2019   MYRINGOTOMY Left 02/07/2019   REDUCTION MAMMAPLASTY     TONSILLECTOMY     AND  ADENOIDS   UPPER GI ENDOSCOPY      Social History   Socioeconomic History   Marital status: Married    Spouse name: Not on file   Number of children: 2   Years of education: Not on file   Highest education level: Not on file  Occupational History   Occupation: unemployed  Tobacco Use   Smoking status: Never   Smokeless tobacco: Never  Vaping Use   Vaping Use: Never used  Substance and Sexual Activity   Alcohol use: Yes    Comment: socially   Drug use: No   Sexual activity: Yes    Birth control/protection: Post-menopausal  Other Topics Concern   Not on file  Social History Narrative   Not on file   Social Determinants of Health   Financial Resource Strain: Not on file  Food Insecurity: Not on file  Transportation Needs: Not on file  Physical Activity: Not on file  Stress: Not on file  Social Connections: Not on file    Family History  Problem Relation Age of Onset   Hypertension Mother    Depression Mother    Sjogren's syndrome Mother    Alcohol abuse Mother    Cancer Father  Asthma Father    COPD Father    Hypertension Father    Hyperlipidemia Father    Prostate cancer Father    Depression Brother    Diabetes Maternal Grandmother    Stroke Maternal Grandfather    Heart attack Paternal Grandfather    Rheum arthritis Cousin     Health Maintenance  Topic Date Due   Hepatitis C Screening  Never done   COVID-19 Vaccine (3 - Booster for [employer] series) 01/24/2020   PAP SMEAR-Modifier  07/28/2021   MAMMOGRAM  08/28/2023   COLONOSCOPY (Pts 45-14yrs Insurance coverage will need to be confirmed)  01/21/2025   TETANUS/TDAP  04/01/2027   INFLUENZA VACCINE  Completed   HIV Screening  Completed   Zoster Vaccines- Shingrix  Completed   HPV VACCINES  Aged Out      ----------------------------------------------------------------------------------------------------------------------------------------------------------------------------------------------------------------- Physical Exam BP (!) 165/90 (BP Location: Left Arm, Patient Position: Sitting, Cuff Size: Normal)   Pulse (!) 102   Temp 99.2 F (37.3 C) (Oral)   Ht 5' (1.524 m)   Wt 127 lb (57.6 kg)   SpO2 100%   BMI 24.80 kg/m   Physical Exam Constitutional:      Appearance: Normal appearance.  Eyes:     General: No scleral icterus. Cardiovascular:     Rate and Rhythm: Normal rate and regular rhythm.  Pulmonary:     Effort: Pulmonary effort is normal.     Breath sounds: Normal breath sounds.  Neurological:     Mental Status: She is alert.  Psychiatric:        Mood and Affect: Mood normal.        Behavior: Behavior normal.     ------------------------------------------------------------------------------------------------------------------------------------------------------------------------------------------------------------------- Assessment and Plan  Borderline hypercholesterolemia Updated lipid panel ordered.  Adult ADHD She continues to do well with Adderall 20 mg twice daily.  We will continue at current strength.     Meds ordered this encounter  Medications   buPROPion (WELLBUTRIN XL) 150 MG 24 hr tablet    Sig: Take 1 tablet (150 mg total) by mouth daily.    Dispense:  90 tablet    Refill:  3   amphetamine-dextroamphetamine (ADDERALL) 20 MG tablet    Sig: Take 1 tablet (20 mg total) by mouth 2 (two) times daily.    Dispense:  180 tablet    Refill:  0    If #180 for 90 days not permitted, please use separate Rx 1) Disp #60 to fill 11/19/20 Refill x0; 2) Disp #60 to fill in 30 days from Rx date Refill x0; 3) Disp #60 to fill in 60 days from Rx date Refill x0    Return in about 6 months (around 03/10/2022) for ADHD.    This visit occurred during the  SARS-CoV-2 public health emergency.  Safety protocols were in place, including screening questions prior to the visit, additional usage of staff PPE, and extensive cleaning of exam room while observing appropriate contact time as indicated for disinfecting solutions.

## 2021-09-13 NOTE — Assessment & Plan Note (Signed)
She continues to do well with Adderall 20 mg twice daily.  We will continue at current strength.

## 2021-09-13 NOTE — Assessment & Plan Note (Signed)
Updated lipid panel ordered. 

## 2021-09-22 ENCOUNTER — Ambulatory Visit (INDEPENDENT_AMBULATORY_CARE_PROVIDER_SITE_OTHER): Payer: 59 | Admitting: Family Medicine

## 2021-09-22 VITALS — BP 159/78 | HR 92

## 2021-09-22 DIAGNOSIS — R030 Elevated blood-pressure reading, without diagnosis of hypertension: Secondary | ICD-10-CM | POA: Diagnosis not present

## 2021-09-22 NOTE — Progress Notes (Signed)
Medical screening examination/treatment was performed by qualified clinical staff member and as supervising physician I was immediately available for consultation/collaboration. I have reviewed documentation and agree with assessment and plan.  Atari Novick, DO  

## 2021-09-22 NOTE — Progress Notes (Signed)
   Established Patient Office Visit  Subjective   Patient ID: Adriana Gordon, female    DOB: 1965-05-28  Age: 56 y.o. MRN: [id number]  Chief Complaint  Patient presents with   Blood Pressure Check    HPI  Adriana Gordon is here for blood pressure check. Denies chest pain, shortness of breath or dizziness. She states her home numbers were better. She has not checked them lately.   ROS    Objective:     BP (!) 155/78   Pulse 92   SpO2 100%    Physical Exam   No results found for any visits on 09/22/21.    The 10-year ASCVD risk score (Arnett DK, et al., 2019) is: 3.5%    Assessment & Plan:  Elevated blood pressure - Per Dr Ashley Royalty, patient advised to check home blood pressure readings and keep a log. Bring blood pressure log to a follow up appointment with Dr Ashley Royalty in 6 weeks.   Problem List Items Addressed This Visit   None Visit Diagnoses     Elevated blood pressure reading without diagnosis of hypertension    -  Primary       Return in about 6 weeks (around 11/03/2021) for blood pressure check with Dr Ashley Royalty. Bring home blood pressure readings. Earna Coder, Janalyn Harder, CMA

## 2021-10-13 ENCOUNTER — Ambulatory Visit: Payer: 59 | Admitting: Family Medicine

## 2021-10-30 DIAGNOSIS — E7800 Pure hypercholesterolemia, unspecified: Secondary | ICD-10-CM | POA: Diagnosis not present

## 2021-10-30 DIAGNOSIS — Z8639 Personal history of other endocrine, nutritional and metabolic disease: Secondary | ICD-10-CM | POA: Diagnosis not present

## 2021-10-30 DIAGNOSIS — E559 Vitamin D deficiency, unspecified: Secondary | ICD-10-CM | POA: Diagnosis not present

## 2021-10-30 DIAGNOSIS — R030 Elevated blood-pressure reading, without diagnosis of hypertension: Secondary | ICD-10-CM | POA: Diagnosis not present

## 2021-10-31 LAB — CBC WITH DIFFERENTIAL/PLATELET
Absolute Monocytes: 490 cells/uL (ref 200–950)
Basophils Absolute: 63 cells/uL (ref 0–200)
Basophils Relative: 0.9 %
Eosinophils Absolute: 77 cells/uL (ref 15–500)
Eosinophils Relative: 1.1 %
HCT: 43 % (ref 35.0–45.0)
Hemoglobin: 14.5 g/dL (ref 11.7–15.5)
Lymphs Abs: 1918 cells/uL (ref 850–3900)
MCH: 31 pg (ref 27.0–33.0)
MCHC: 33.7 g/dL (ref 32.0–36.0)
MCV: 91.9 fL (ref 80.0–100.0)
MPV: 10.4 fL (ref 7.5–12.5)
Monocytes Relative: 7 %
Neutro Abs: 4452 cells/uL (ref 1500–7800)
Neutrophils Relative %: 63.6 %
Platelets: 368 10*3/uL (ref 140–400)
RBC: 4.68 10*6/uL (ref 3.80–5.10)
RDW: 13.3 % (ref 11.0–15.0)
Total Lymphocyte: 27.4 %
WBC: 7 10*3/uL (ref 3.8–10.8)

## 2021-10-31 LAB — COMPLETE METABOLIC PANEL WITH GFR
AG Ratio: 1.7 (calc) (ref 1.0–2.5)
ALT: 36 U/L — ABNORMAL HIGH (ref 6–29)
AST: 30 U/L (ref 10–35)
Albumin: 4.7 g/dL (ref 3.6–5.1)
Alkaline phosphatase (APISO): 107 U/L (ref 37–153)
BUN: 20 mg/dL (ref 7–25)
CO2: 29 mmol/L (ref 20–32)
Calcium: 9.9 mg/dL (ref 8.6–10.4)
Chloride: 102 mmol/L (ref 98–110)
Creat: 0.95 mg/dL (ref 0.50–1.03)
Globulin: 2.8 g/dL (calc) (ref 1.9–3.7)
Glucose, Bld: 103 mg/dL (ref 65–139)
Potassium: 4.6 mmol/L (ref 3.5–5.3)
Sodium: 139 mmol/L (ref 135–146)
Total Bilirubin: 0.4 mg/dL (ref 0.2–1.2)
Total Protein: 7.5 g/dL (ref 6.1–8.1)
eGFR: 70 mL/min/{1.73_m2} (ref >=60)

## 2021-10-31 LAB — LIPID PANEL W/REFLEX DIRECT LDL
Cholesterol: 226 mg/dL — ABNORMAL HIGH (ref <200)
HDL: 58 mg/dL (ref >=50)
LDL Cholesterol (Calc): 129 mg/dL (calc) — ABNORMAL HIGH
Non-HDL Cholesterol (Calc): 168 mg/dL (calc) — ABNORMAL HIGH (ref <130)
Total CHOL/HDL Ratio: 3.9 (calc) (ref <5.0)
Triglycerides: 243 mg/dL — ABNORMAL HIGH (ref <150)

## 2021-10-31 LAB — VITAMIN D 25 HYDROXY (VIT D DEFICIENCY, FRACTURES): Vit D, 25-Hydroxy: 45 ng/mL (ref 30–100)

## 2021-10-31 LAB — TSH: TSH: 2.02 mIU/L (ref 0.40–4.50)

## 2021-11-02 ENCOUNTER — Encounter (INDEPENDENT_AMBULATORY_CARE_PROVIDER_SITE_OTHER): Payer: 59 | Admitting: Family Medicine

## 2021-11-02 DIAGNOSIS — I10 Essential (primary) hypertension: Secondary | ICD-10-CM

## 2021-11-03 ENCOUNTER — Ambulatory Visit: Payer: 59 | Admitting: Family Medicine

## 2021-11-03 MED ORDER — LOSARTAN POTASSIUM 25 MG PO TABS: 25.0000 mg | ORAL_TABLET | Freq: Every day | ORAL | 1 refills | Status: DC

## 2021-11-03 NOTE — Telephone Encounter (Signed)
Please see the MyChart message reply(ies) for my assessment and plan.    This patient gave consent for this Medical Advice Message and is aware that it may result in a bill to their insurance company, as well as the possibility of receiving a bill for a co-payment or deductible. They are an established patient, but are not seeking medical advice exclusively about a problem treated during an in person or video visit in the last seven days. I did not recommend an in person or video visit within seven days of my reply.    I spent a total of 8 minutes cumulative time within 7 days through MyChart messaging.  Teandre Hamre, DO   

## 2021-11-04 ENCOUNTER — Ambulatory Visit: Payer: 59 | Admitting: Family Medicine

## 2022-03-10 ENCOUNTER — Ambulatory Visit (INDEPENDENT_AMBULATORY_CARE_PROVIDER_SITE_OTHER): Payer: 59 | Admitting: Family Medicine

## 2022-03-10 ENCOUNTER — Encounter: Payer: Self-pay | Admitting: Family Medicine

## 2022-03-10 VITALS — BP 156/100 | HR 90 | Ht 60.0 in | Wt 137.0 lb

## 2022-03-10 DIAGNOSIS — I10 Essential (primary) hypertension: Secondary | ICD-10-CM | POA: Insufficient documentation

## 2022-03-10 NOTE — Progress Notes (Signed)
Adriana Gordon - 57 y.o. female MRN [id number]  Date of birth: [birth year]  Subjective Chief Complaint  Patient presents with   Hypertension   ADHD    HPI Adriana Gordon is a 57 y.o. female here today for follow up visit.   She has temporarily stopped her adderall to see if this had an effect on her blood pressure.  She has noted several stressors over the past few months including her father being treated for lymphoma, her mother having a fall and progressive dementia and her daughter and son in law moving back to [facility].  They are planning on moving to [facility] as well to be closer to them.  She has noticed some difference being off of adderall, feels like she probably needs to be on this with everything that is going on.  BP remains elevated on initial check today.  She is taking losartan 14m daily for management of her HTN.  She is tolerating this well and has not noted any significant side effects at current strength.    ROS:  A comprehensive ROS was completed and negative except as noted per HPI  Allergies  Allergen Reactions   Phenergan [Promethazine Hcl]     "HYPERACTIVE AND JUMPY"   Promethazine Hcl    Warfarin And Related     " DUE TO METABOLIZES MEDS SHOULD NOT TAKE, MAY OVERWORK ON PATIENT"    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    Nabothian cyst 07/14/2016   OA (osteoarthritis)    Ovarian cyst     Past Surgical History:  Procedure Laterality Date   BACK SURGERY     BREAST SURGERY     reduction   CERVICAL FUSION  11/30/2018   C 5 TO C 7, C 4 TO C 5 SECOND SX   CESAREAN SECTION     COLONOSCOPY  2017   ENDOMETRIAL ABLATION  2006   EVALUATION UNDER ANESTHESIA WITH HEMORRHOIDECTOMY N/A 03/30/2019   Procedure: SINGLE COLUMN  HEMORRHOIDECTOMY;  Surgeon: TLeighton Ruff MD;  Location[facility]  Service: General;  Laterality: N/A;   MOUTH SURGERY  03/21/2019   MYRINGOTOMY Left 02/07/2019   REDUCTION MAMMAPLASTY     TONSILLECTOMY     AND ADENOIDS   UPPER GI ENDOSCOPY      Social History   Socioeconomic History   Marital status: Married    Spouse name: Not on file   Number of children: 2   Years of education: Not on file   Highest education level: Not on file  Occupational History   Occupation: unemployed  Tobacco Use   Smoking status: Never   Smokeless tobacco: Never  Vaping Use   Vaping Use: Never used  Substance and Sexual Activity   Alcohol use: Yes    Comment: socially   Drug use: No   Sexual activity: Yes    Birth control/protection: Post-menopausal  Other Topics Concern   Not on file  Social History Narrative   Not on file   Social Determinants of Health   Financial Resource Strain: Not on file  Food Insecurity: Not on file  Transportation Needs: Not on file  Physical Activity: Not on file  Stress: Not on file  Social Connections: Not on file    Family History  Problem Relation Age of Onset   Hypertension Mother    Depression Mother    Sjogren's syndrome Mother    Alcohol abuse Mother    Cancer Father    Asthma Father    COPD Father    Hypertension Father    Hyperlipidemia Father    Prostate cancer Father    Depression Brother    Diabetes Maternal Grandmother    Stroke Maternal Grandfather    Heart attack Paternal Grandfather    Rheum arthritis Cousin     Health Maintenance  Topic Date Due   Hepatitis C Screening  Never done   PAP SMEAR-Modifier  03/11/2023 (Originally 07/28/2021)   COVID-19 Vaccine (3 - 2023-24 season) 03/26/2023 (Originally 09/18/2021)   MAMMOGRAM  08/28/2023   COLONOSCOPY (Pts 45-74yr Insurance coverage will need to be confirmed)  01/21/2025   DTaP/Tdap/Td (2 - Td or Tdap) 04/01/2027   INFLUENZA VACCINE  Completed   HIV  Screening  Completed   Zoster Vaccines- Shingrix  Completed   HPV VACCINES  Aged Out     ----------------------------------------------------------------------------------------------------------------------------------------------------------------------------------------------------------------- Physical Exam BP (!) 156/100 (BP Location: Left Arm, Patient Position: Sitting, Cuff Size: Normal)   Pulse 90   Ht 5' (1.524 m)   Wt 137 lb (62.1 kg)   SpO2 100%   BMI 26.76 kg/m   Physical Exam Constitutional:      Appearance: Normal appearance.  Neurological:     General: No focal deficit present.     Mental Status: She is alert.  Psychiatric:        Mood and Affect: Mood normal.        Behavior: Behavior normal.     ------------------------------------------------------------------------------------------------------------------------------------------------------------------------------------------------------------------- Assessment and Plan  Essential hypertension She will check BP readings at home.  If these are elevated she is instructed to increase her losartan to 570m  She will send me these readings via mychart as well.  Stress may be contributing.  Will hold off on adding adderall back on until BP is under better control.   No orders of the defined types were placed in this encounter.   Return in about 3 months (around 06/08/2022) for F/u HTN/ADHD.    This visit occurred during the SARS-CoV-2 public health emergency.  Safety protocols were in place, including screening questions prior to the visit, additional usage of staff PPE, and extensive cleaning of exam room while observing appropriate contact time as indicated for disinfecting solutions.

## 2022-03-10 NOTE — Assessment & Plan Note (Signed)
She will check BP readings at home.  If these are elevated she is instructed to increase her losartan to 43m.  She will send me these readings via mychart as well.  Stress may be contributing.  Will hold off on adding adderall back on until BP is under better control.

## 2022-03-10 NOTE — Patient Instructions (Signed)
Check BP at home and send readings over the next couple of weeks.  If this remains elevated we'll increase losartan to 73m daily.

## 2022-04-06 ENCOUNTER — Encounter: Payer: Self-pay | Admitting: Family Medicine

## 2022-04-06 MED ORDER — LOSARTAN POTASSIUM 25 MG PO TABS: 25.0000 mg | ORAL_TABLET | Freq: Every day | ORAL | 1 refills | Status: DC

## 2022-04-06 MED ORDER — LOSARTAN POTASSIUM 50 MG PO TABS: 50.0000 mg | ORAL_TABLET | Freq: Every day | ORAL | 1 refills | Status: DC

## 2022-04-23 ENCOUNTER — Telehealth: Payer: 59 | Admitting: Nurse Practitioner

## 2022-04-23 DIAGNOSIS — N3000 Acute cystitis without hematuria: Secondary | ICD-10-CM | POA: Diagnosis not present

## 2022-04-23 MED ORDER — NITROFURANTOIN MONOHYD MACRO 100 MG PO CAPS: 100.0000 mg | ORAL_CAPSULE | Freq: Two times a day (BID) | ORAL | 0 refills | Status: DC

## 2022-04-23 NOTE — Progress Notes (Signed)

## 2022-04-26 MED ORDER — CEPHALEXIN 500 MG PO CAPS: 500.0000 mg | ORAL_CAPSULE | Freq: Two times a day (BID) | ORAL | 0 refills | Status: AC

## 2022-04-26 NOTE — Addendum Note (Signed)
Addended by: Waldon Merl on: 04/26/2022 11:13 AM   Modules accepted: Orders

## 2022-05-03 ENCOUNTER — Telehealth: Payer: 59 | Admitting: Nurse Practitioner

## 2022-05-03 DIAGNOSIS — R21 Rash and other nonspecific skin eruption: Secondary | ICD-10-CM

## 2022-05-03 NOTE — Progress Notes (Signed)
Because your symptoms are persistent and worsening after being off the medication for one, I feel your condition warrants further evaluation and I recommend that you be seen in a face to face visit.   Also note we cannot see patients or send prescriptions outside of [facility]  NOTE: There will be NO CHARGE for this eVisit   If you are having a true medical emergency please call 911.      For an urgent face to face visit, [facility] has eight urgent care centers for your convenience:   NEW!! [facility] Health Urgent Care Center at [name] Get Driving Directions [phone] [address] [place], [address]    [facility] Health Urgent Care Center at [facility] Get Driving Directions [phone] [address] [place], [place] [address]   [facility] Health Urgent Care Center [facility]) Get Driving Directions [phone] [address] [place], [place] [address]  [facility] Health Urgent Care Center [facility]) Get Driving Directions [phone] [address] [place],  [place]  [address]  [facility] Health Urgent Care Center [facility] - at [employer]  [phone] [address] [place],  [place] [address]   [facility] Health Urgent Care at [facility] Get Driving Directions [phone] [address] [place], [place] [address]   [facility] Health Urgent Care at [facility] Get Driving Directions  [phone] [address] [place], [place] [address]   [facility] Health Urgent Care at [facility] Directions [phone] [address] [place], [place] [address]  Your MyChart E-visit questionnaire answers were reviewed by a board certified advanced clinical practitioner to complete your personal care plan based on your specific symptoms.  Thank you for using e-Visits.

## 2022-05-12 ENCOUNTER — Telehealth: Payer: 59 | Admitting: Nurse Practitioner

## 2022-05-12 DIAGNOSIS — J4521 Mild intermittent asthma with (acute) exacerbation: Secondary | ICD-10-CM | POA: Diagnosis not present

## 2022-05-12 MED ORDER — ALBUTEROL SULFATE HFA 108 (90 BASE) MCG/ACT IN AERS: 2.0000 | INHALATION_SPRAY | Freq: Four times a day (QID) | RESPIRATORY_TRACT | 0 refills | Status: AC | PRN

## 2022-05-12 MED ORDER — PREDNISONE 20 MG PO TABS: 20.0000 mg | ORAL_TABLET | Freq: Two times a day (BID) | ORAL | 0 refills | Status: AC

## 2022-05-12 NOTE — Progress Notes (Signed)
E-Visit for Asthma  Based on what you have shared with me, it looks like you may have a flare up of your asthma.  Asthma is a chronic (ongoing) lung disease which results in airway obstruction, inflammation and hyper-responsiveness.   Asthma symptoms vary from person to person, with common symptoms including nighttime awakening and decreased ability to participate in normal activities as a result of shortness of breath. It is often triggered by changes in weather, changes in the season, changes in air temperature, or inside (home, school, daycare or work) allergens such as animal dander, mold, mildew, woodstoves or cockroaches.   It can also be triggered by hormonal changes, extreme emotion, physical exertion or an upper respiratory tract illness.     It is important to identify the trigger, and then eliminate or avoid the trigger if possible.   If you have been prescribed medications to be taken on a regular basis, it is important to follow the asthma action plan and to follow guidelines to adjust medication in response to increasing symptoms of decreased peak expiratory flow rate  Treatment: I have prescribed: Albuterol (Proventil HFA; Ventolin HFA) 108 (90 Base) MCG/ACT Inhaler 2 puffs into the lungs every six hours as needed for wheezing or shortness of breath and Prednisone  by mouth per day for 5 days Please reschedule with your primary care to be evaluated in person for your recurrent asthma and new symptoms     HOME CARE Only take medications as instructed by your medical team. Consider wearing a mask or scarf to improve breathing air temperature have been shown to decrease irritation and decrease exacerbations Get rest. Taking a steamy shower or using a humidifier may help nasal congestion sand ease sore throat pain. You can place a towel over your head and breathe in the  steam from hot water coming from a faucet. Using a saline nasal spray works much the same way.  Cough drops, hare candies and sore throat lozenges may ease your cough.  Avoid close contacts especially the very you and the elderly Cover your mouth if you cough or sneeze Always remember to wash your hands.    GET HELP RIGHT AWAY IF: You develop worsening symptoms; breathlessness at rest, drowsy, confused or agitated, unable to speak in full sentences You have coughing fits You develop a severe headache or visual changes You develop shortness of breath, difficulty breathing or start having chest pain Your symptoms persist after you have completed your treatment plan If your symptoms do not improve within 10 days  MAKE SURE YOU Understand these instructions. Will watch your condition. Will get help right away if you are not doing well or get worse.   Your e-visit answers were reviewed by a board certified advanced clinical practitioner to complete your personal care plan, Depending upon the condition, your plan could have included both over the counter or prescription medications.   Please review your pharmacy choice. Your safety is important to [place]. If you have drug allergies check your prescription carefully.  You can use MyChart to ask questions about today's visit, request a non-urgent  call back, or ask for a work or school excuse for 24 hours related to this e-Visit. If it has been greater than 24 hours you will need to follow up with your provider, or enter a new e-Visit to address those concerns.   You will get an e-mail in the next two days asking about your experience. I hope that your e-visit has been valuable  and will speed your recovery. Thank you for using e-visits.  Meds ordered this encounter  Medications   albuterol (VENTOLIN HFA) 108 (90 Base) MCG/ACT inhaler    Sig: Inhale 2 puffs into the lungs every 6 (six) hours as needed for wheezing or shortness of breath.     Dispense:  8 g    Refill:  0   predniSONE (DELTASONE) 20 MG tablet    Sig: Take 1 tablet (20 mg total) by mouth 2 (two) times daily with a meal for 5 days.    Dispense:  10 tablet    Refill:  0    I spent approximately 5 minutes reviewing the patient's history, current symptoms and coordinating their care today.

## 2022-05-26 ENCOUNTER — Other Ambulatory Visit: Payer: Self-pay | Admitting: Family Medicine

## 2022-05-26 DIAGNOSIS — F909 Attention-deficit hyperactivity disorder, unspecified type: Secondary | ICD-10-CM

## 2022-05-26 DIAGNOSIS — F3341 Major depressive disorder, recurrent, in partial remission: Secondary | ICD-10-CM

## 2022-06-03 DIAGNOSIS — Z8249 Family history of ischemic heart disease and other diseases of the circulatory system: Secondary | ICD-10-CM | POA: Diagnosis not present

## 2022-06-03 DIAGNOSIS — Z8042 Family history of malignant neoplasm of prostate: Secondary | ICD-10-CM | POA: Diagnosis not present

## 2022-06-03 DIAGNOSIS — Z807 Family history of other malignant neoplasms of lymphoid, hematopoietic and related tissues: Secondary | ICD-10-CM | POA: Diagnosis not present

## 2022-06-03 DIAGNOSIS — Z8269 Family history of other diseases of the musculoskeletal system and connective tissue: Secondary | ICD-10-CM | POA: Diagnosis not present

## 2022-06-03 DIAGNOSIS — R32 Unspecified urinary incontinence: Secondary | ICD-10-CM | POA: Diagnosis not present

## 2022-06-03 DIAGNOSIS — Z818 Family history of other mental and behavioral disorders: Secondary | ICD-10-CM | POA: Diagnosis not present

## 2022-06-03 DIAGNOSIS — Z881 Allergy status to other antibiotic agents status: Secondary | ICD-10-CM | POA: Diagnosis not present

## 2022-06-03 DIAGNOSIS — I10 Essential (primary) hypertension: Secondary | ICD-10-CM | POA: Diagnosis not present

## 2022-06-03 DIAGNOSIS — F909 Attention-deficit hyperactivity disorder, unspecified type: Secondary | ICD-10-CM | POA: Diagnosis not present

## 2022-06-03 DIAGNOSIS — Z87891 Personal history of nicotine dependence: Secondary | ICD-10-CM | POA: Diagnosis not present

## 2022-06-03 DIAGNOSIS — E785 Hyperlipidemia, unspecified: Secondary | ICD-10-CM | POA: Diagnosis not present

## 2022-06-03 DIAGNOSIS — Z825 Family history of asthma and other chronic lower respiratory diseases: Secondary | ICD-10-CM | POA: Diagnosis not present

## 2022-06-08 ENCOUNTER — Other Ambulatory Visit: Payer: Self-pay | Admitting: Nurse Practitioner

## 2022-06-08 ENCOUNTER — Ambulatory Visit (INDEPENDENT_AMBULATORY_CARE_PROVIDER_SITE_OTHER): Payer: 59 | Admitting: Family Medicine

## 2022-06-08 ENCOUNTER — Encounter: Payer: Self-pay | Admitting: Family Medicine

## 2022-06-08 VITALS — BP 151/96 | HR 89 | Ht 60.0 in | Wt 139.0 lb

## 2022-06-08 DIAGNOSIS — I10 Essential (primary) hypertension: Secondary | ICD-10-CM

## 2022-06-08 DIAGNOSIS — F909 Attention-deficit hyperactivity disorder, unspecified type: Secondary | ICD-10-CM | POA: Diagnosis not present

## 2022-06-08 DIAGNOSIS — J4521 Mild intermittent asthma with (acute) exacerbation: Secondary | ICD-10-CM

## 2022-06-08 MED ORDER — CYCLOBENZAPRINE HCL 10 MG PO TABS: 10.0000 mg | ORAL_TABLET | Freq: Every evening | ORAL | 1 refills | Status: DC | PRN

## 2022-06-08 MED ORDER — AMPHETAMINE-DEXTROAMPHETAMINE 20 MG PO TABS
20.0000 mg | ORAL_TABLET | Freq: Two times a day (BID) | ORAL | 0 refills | Status: AC
Start: 2022-06-08 — End: 2022-09-06

## 2022-06-08 NOTE — Progress Notes (Signed)
Adriana Gordon - 57 y.o. female MRN [id number]  Date of birth: Jan 27, 1965  Subjective Chief Complaint  Patient presents with   Hypertension   ADHD    HPI Adriana Gordon is a 57 y.o. female here today for follow up.   Overall she reports that she is doing well.  They are planning on moving to [facility] over the Summer to remain close to her daughter and grandchildren.    Continues to do well with adderall at current strength.  No significant side effects at current strength.    BP elevated on initial check.  She is taking losartan daily.  Recheck blood pressure and this remains elevated.  She does have capability to monitor at home.  She denies symptoms related to hypertension including chest pain, shortness of breath, palpitations, headaches or vision changes.  ROS:  A comprehensive ROS was completed and negative except as noted per HPI  Allergies  Allergen Reactions   Macrobid [Nitrofurantoin] Hives   Phenergan [Promethazine Hcl]     "HYPERACTIVE AND JUMPY"   Warfarin And Related     " DUE TO METABOLIZES MEDS SHOULD NOT TAKE, MAY OVERWORK ON PATIENT"    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    Nabothian cyst 07/14/2016   OA (osteoarthritis)    Ovarian cyst     Past Surgical History:  Procedure Laterality Date   BACK SURGERY     BREAST SURGERY     reduction   CERVICAL FUSION  11/30/2018   C 5 TO C 7, C 4 TO C 5 SECOND SX   CESAREAN SECTION     COLONOSCOPY  2017   ENDOMETRIAL ABLATION  2006   EVALUATION UNDER ANESTHESIA WITH HEMORRHOIDECTOMY N/A 03/30/2019   Procedure: SINGLE COLUMN HEMORRHOIDECTOMY;  Surgeon: Romie Levee, MD;  Location[facility];  Service: General;  Laterality: N/A;   MOUTH SURGERY  03/21/2019    MYRINGOTOMY Left 02/07/2019   REDUCTION MAMMAPLASTY     TONSILLECTOMY     AND ADENOIDS   UPPER GI ENDOSCOPY      Social History   Socioeconomic History   Marital status: Married    Spouse name: Not on file   Number of children: 2   Years of education: Not on file   Highest education level: Bachelor's degree (e.g., BA, AB, BS)  Occupational History   Occupation: unemployed  Tobacco Use   Smoking status: Never   Smokeless tobacco: Never  Vaping Use   Vaping Use: Never used  Substance and Sexual Activity   Alcohol use: Yes    Comment: socially   Drug use: No   Sexual activity: Yes    Birth control/protection: Post-menopausal  Other Topics Concern   Not on file  Social History Narrative   Not on file   Social Determinants of Health   Financial Resource Strain: Low Risk  (06/07/2022)   Overall Financial Resource Strain (CARDIA)    Difficulty of Paying Living Expenses: Not hard at all  Food Insecurity: No Food Insecurity (06/07/2022)   Hunger Vital Sign    Worried About Running Out of Food in the Last Year: Never true    Ran Out of  Food in the Last Year: Never true  Transportation Needs: No Transportation Needs (06/07/2022)   PRAPARE - Administrator, Civil Service (Medical): No    Lack of Transportation (Non-Medical): No  Physical Activity: Sufficiently Active (06/07/2022)   Exercise Vital Sign    Days of Exercise per Week: 4 days    Minutes of Exercise per Session: 60 min  Stress: No Stress Concern Present (06/07/2022)   [employer] of Occupational Health - Occupational Stress Questionnaire    Feeling of Stress : Not at all  Social Connections: Moderately Isolated (06/07/2022)   Social Connection and Isolation Panel [NHANES]    Frequency of Communication with Friends and Family: More than three times a week    Frequency of Social Gatherings with Friends and Family: More than three times a week    Attends Religious Services: Never    Doctor, general practice or Organizations: No    Attends Engineer, structural: Not on file    Marital Status: Married    Family History  Problem Relation Age of Onset   Hypertension Mother    Depression Mother    Sjogren's syndrome Mother    Alcohol abuse Mother    Cancer Father    Asthma Father    COPD Father    Hypertension Father    Hyperlipidemia Father    Prostate cancer Father    Depression Brother    Diabetes Maternal Grandmother    Stroke Maternal Grandfather    Heart attack Paternal Grandfather    Rheum arthritis Cousin     Health Maintenance  Topic Date Due   PAP SMEAR-Modifier  03/11/2023 (Originally 07/28/2021)   COVID-19 Vaccine (3 - 2023-24 season) 03/26/2023 (Originally 09/18/2021)   Hepatitis C Screening  06/08/2023 (Originally [birth year]/1982)   INFLUENZA VACCINE  08/19/2022   MAMMOGRAM  08/28/2023   COLONOSCOPY (Pts 45-40yrs Insurance coverage will need to be confirmed)  01/21/2025   DTaP/Tdap/Td (2 - Td or Tdap) 04/01/2027   HIV Screening  Completed   Zoster Vaccines- Shingrix  Completed   HPV VACCINES  Aged Out     ----------------------------------------------------------------------------------------------------------------------------------------------------------------------------------------------------------------- Physical Exam BP (!) 151/96 (BP Location: Left Arm, Patient Position: Sitting, Cuff Size: Small)   Pulse 89   Ht 5' (1.524 m)   Wt 139 lb (63 kg)   SpO2 100%   BMI 27.15 kg/m   Physical Exam Constitutional:      Appearance: Normal appearance.  HENT:     Head: Normocephalic and atraumatic.  Eyes:     General: No scleral icterus. Neurological:     Mental Status: She is alert.  Psychiatric:        Mood and Affect: Mood normal.        Behavior: Behavior normal.      ------------------------------------------------------------------------------------------------------------------------------------------------------------------------------------------------------------------- Assessment and Plan  Essential hypertension Blood pressure is elevated in clinic.  Readings at home have been better.  Encouraged her to monitor at home.  She will be establishing with a new primary care provider as well within the next few months.  She will let me know if this continues to run high at home.  Adult ADHD Adderall renewed.   Meds ordered this encounter  Medications   cyclobenzaprine (FLEXERIL) 10 MG tablet    Sig: Take 1 tablet (10 mg total) by mouth at bedtime as needed for muscle spasms.    Dispense:  90 tablet    Refill:  1   amphetamine-dextroamphetamine (ADDERALL) 20 MG tablet  Sig: Take 1 tablet (20 mg total) by mouth 2 (two) times daily.    Dispense:  180 tablet    Refill:  0    If #180 for 90 days not permitted, please use separate Rx 1) Disp #60 to fill 11/19/20 Refill x0; 2) Disp #60 to fill in 30 days from Rx date Refill x0; 3) Disp #60 to fill in 60 days from Rx date Refill x0    No follow-ups on file.    This visit occurred during the SARS-CoV-2 public health emergency.  Safety protocols were in place, including screening questions prior to the visit, additional usage of staff PPE, and extensive cleaning of exam room while observing appropriate contact time as indicated for disinfecting solutions.

## 2022-06-08 NOTE — Assessment & Plan Note (Signed)
Adderall renewed.

## 2022-06-08 NOTE — Assessment & Plan Note (Signed)
Blood pressure is elevated in clinic.  Readings at home have been better.  Encouraged her to monitor at home.  She will be establishing with a new primary care provider as well within the next few months.  She will let me know if this continues to run high at home.

## 2022-09-10 ENCOUNTER — Other Ambulatory Visit: Payer: Self-pay | Admitting: Family Medicine

## 2022-11-04 ENCOUNTER — Other Ambulatory Visit: Payer: Self-pay | Admitting: Family Medicine

## 2022-12-07 ENCOUNTER — Encounter: Payer: Self-pay | Admitting: Family Medicine

## 2022-12-07 ENCOUNTER — Other Ambulatory Visit: Payer: Self-pay | Admitting: Family Medicine

## 2022-12-07 DIAGNOSIS — F909 Attention-deficit hyperactivity disorder, unspecified type: Secondary | ICD-10-CM

## 2022-12-07 DIAGNOSIS — F3341 Major depressive disorder, recurrent, in partial remission: Secondary | ICD-10-CM

## 2022-12-07 MED ORDER — LOSARTAN POTASSIUM 50 MG PO TABS: 50.0000 mg | ORAL_TABLET | Freq: Every day | ORAL | 0 refills | Status: AC

## 2022-12-07 MED ORDER — BUPROPION HCL ER (XL) 150 MG PO TB24
150.0000 mg | ORAL_TABLET | Freq: Every day | ORAL | 0 refills | Status: DC
Start: 2022-12-07 — End: 2023-03-08

## 2022-12-09 MED ORDER — CYCLOBENZAPRINE HCL 10 MG PO TABS: 10.0000 mg | ORAL_TABLET | Freq: Every evening | ORAL | 0 refills | Status: DC | PRN

## 2023-03-05 ENCOUNTER — Other Ambulatory Visit: Payer: Self-pay | Admitting: Family Medicine

## 2023-03-05 DIAGNOSIS — F3341 Major depressive disorder, recurrent, in partial remission: Secondary | ICD-10-CM

## 2023-03-05 DIAGNOSIS — F909 Attention-deficit hyperactivity disorder, unspecified type: Secondary | ICD-10-CM

## 2023-03-08 NOTE — Telephone Encounter (Signed)
Pls contact pt to schedule 51-month appt with Dr. Ashley Royalty. Due in Feb. Sending 30 day refill. Thanks

## 2023-03-09 NOTE — Telephone Encounter (Signed)
Called patient, LVM to call office to schedule appt in Feb, thanks.

## 2023-04-09 ENCOUNTER — Other Ambulatory Visit: Payer: Self-pay | Admitting: Family Medicine

## 2023-04-11 NOTE — Telephone Encounter (Signed)
 [place], appt not needed.

## 2023-04-11 NOTE — Telephone Encounter (Signed)
 Pls contact pt to schedule appt with Dr. Ashley Royalty. Last OV: 06/08/22. Unable to refill meds without kept appt. Thanks

## 2023-05-08 ENCOUNTER — Other Ambulatory Visit: Payer: Self-pay | Admitting: Family Medicine

## 2023-05-08 DIAGNOSIS — F909 Attention-deficit hyperactivity disorder, unspecified type: Secondary | ICD-10-CM

## 2023-05-08 DIAGNOSIS — F3341 Major depressive disorder, recurrent, in partial remission: Secondary | ICD-10-CM

## 2023-09-20 ENCOUNTER — Encounter: Payer: Self-pay | Admitting: Sports Medicine
